# Patient Record
Sex: Female | Born: 1960
Health system: Southern US, Community
[De-identification: ages and names within clinical notes are randomized; demographics above are authoritative.]

## PROBLEM LIST (undated history)

## (undated) DIAGNOSIS — T7840XA Allergy, unspecified, initial encounter: Secondary | ICD-10-CM

## (undated) DIAGNOSIS — D126 Benign neoplasm of colon, unspecified: Secondary | ICD-10-CM

## (undated) DIAGNOSIS — N6099 Unspecified benign mammary dysplasia of unspecified breast: Secondary | ICD-10-CM

## (undated) DIAGNOSIS — D649 Anemia, unspecified: Secondary | ICD-10-CM

## (undated) HISTORY — DX: Anemia, unspecified: D64.9

## (undated) HISTORY — DX: Unspecified benign mammary dysplasia of unspecified breast: N60.99

## (undated) HISTORY — DX: Benign neoplasm of colon, unspecified: D12.6

## (undated) HISTORY — DX: Allergy, unspecified, initial encounter: T78.40XA

## (undated) HISTORY — PX: COLONOSCOPY: SHX174

---
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
## 2000-07-07 ENCOUNTER — Other Ambulatory Visit: Admission: RE | Admit: 2000-07-07 | Discharge: 2000-07-07 | Payer: Self-pay | Admitting: Obstetrics and Gynecology

## 2002-02-15 ENCOUNTER — Encounter: Admission: RE | Admit: 2002-02-15 | Discharge: 2002-02-15 | Payer: Self-pay | Admitting: Obstetrics and Gynecology

## 2002-02-15 ENCOUNTER — Encounter: Payer: Self-pay | Admitting: Obstetrics and Gynecology

## 2002-04-24 ENCOUNTER — Encounter: Payer: Self-pay | Admitting: Obstetrics and Gynecology

## 2002-04-24 ENCOUNTER — Encounter: Admission: RE | Admit: 2002-04-24 | Discharge: 2002-04-24 | Payer: Self-pay | Admitting: Unknown Physician Specialty

## 2002-10-07 ENCOUNTER — Other Ambulatory Visit: Admission: RE | Admit: 2002-10-07 | Discharge: 2002-10-07 | Payer: Self-pay | Admitting: Obstetrics and Gynecology

## 2003-10-13 ENCOUNTER — Other Ambulatory Visit: Admission: RE | Admit: 2003-10-13 | Discharge: 2003-10-13 | Payer: Self-pay | Admitting: Obstetrics and Gynecology

## 2004-11-01 ENCOUNTER — Other Ambulatory Visit: Admission: RE | Admit: 2004-11-01 | Discharge: 2004-11-01 | Payer: Self-pay | Admitting: Obstetrics and Gynecology

## 2006-02-21 ENCOUNTER — Other Ambulatory Visit: Admission: RE | Admit: 2006-02-21 | Discharge: 2006-02-21 | Payer: Self-pay | Admitting: Obstetrics and Gynecology

## 2006-05-30 ENCOUNTER — Emergency Department (HOSPITAL_COMMUNITY): Admission: EM | Admit: 2006-05-30 | Discharge: 2006-05-30 | Payer: Self-pay | Admitting: Family Medicine

## 2007-06-25 ENCOUNTER — Emergency Department (HOSPITAL_COMMUNITY): Admission: EM | Admit: 2007-06-25 | Discharge: 2007-06-25 | Payer: Self-pay | Admitting: Emergency Medicine

## 2007-07-02 ENCOUNTER — Emergency Department (HOSPITAL_COMMUNITY): Admission: EM | Admit: 2007-07-02 | Discharge: 2007-07-02 | Payer: Self-pay | Admitting: Family Medicine

## 2007-10-11 IMAGING — CT CT HEAD W/O CM
4 of 6 series · 16 of 47 positions shown, 17 images · non-contrast
Comparison: none

CLINICAL DATA: Syncope. Fall. Injury to the head.

HEAD CT WITHOUT CONTRAST
TECHNIQUE: 5mm collimated images were obtained from the skull base through the
vertex following the standard protocol without intravenous contrast.
TECHNIQUE: Multidetector CT imaging of the cervical spine was performed. 
Sagittal and coronal plane reformatted images were reconstructed from the axial
CT data, and were also reviewed.

[Series 3: head trauma 4.8 h47s · axial · 0.43mm/px · z∈[+937,+1023]mm · 4 of 30 slices shown, 5 images]
[im 6/30  brain]
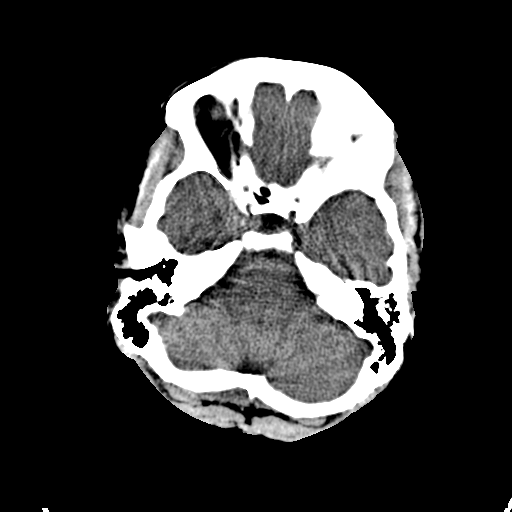
[im 6/30  bone]
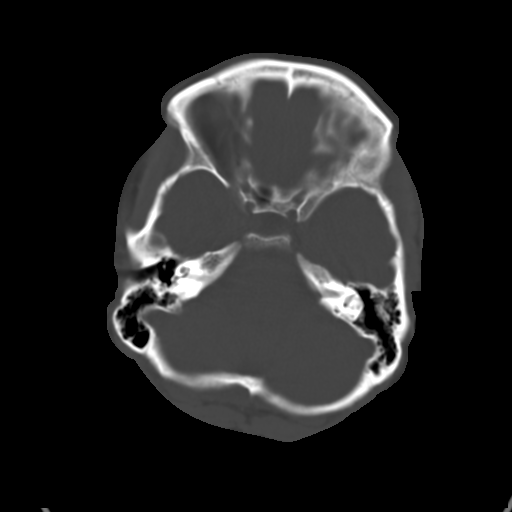
[im 12/30  brain]
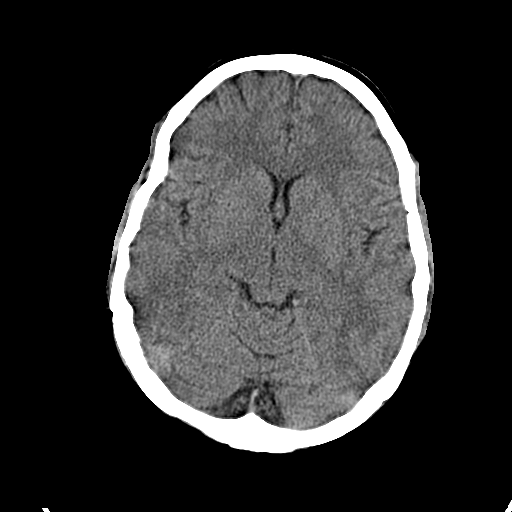
[im 18/30  brain]
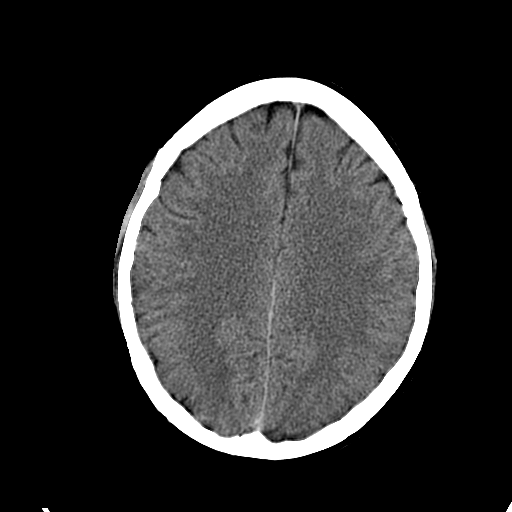
[im 24/30  brain]
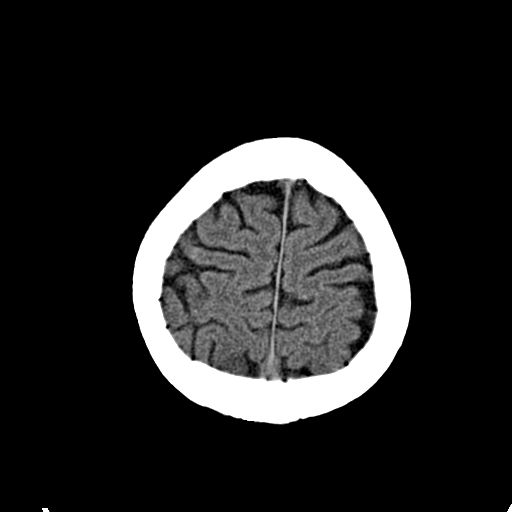

[Series 6: c_spine 2.0 b31s detail · axial · 0.29mm/px · z∈[+740,+922]mm · 8 of 111 slices shown]
[im 10/111  bone]
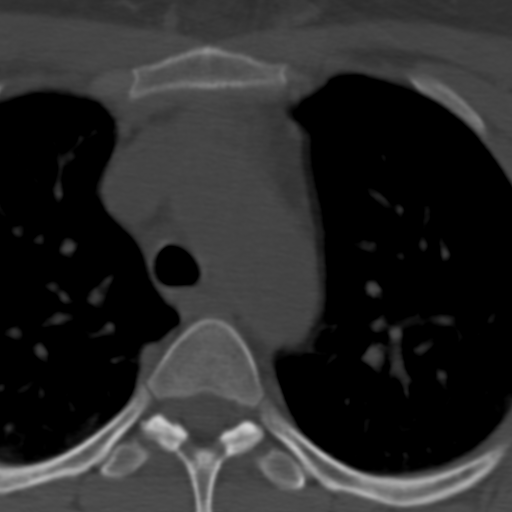
[im 24/111  bone]
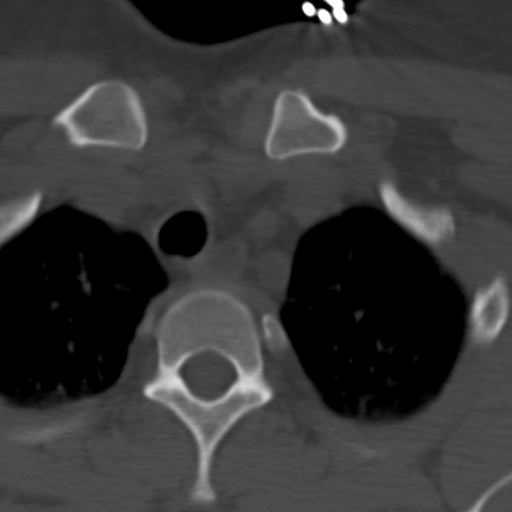
[im 34/111  bone]
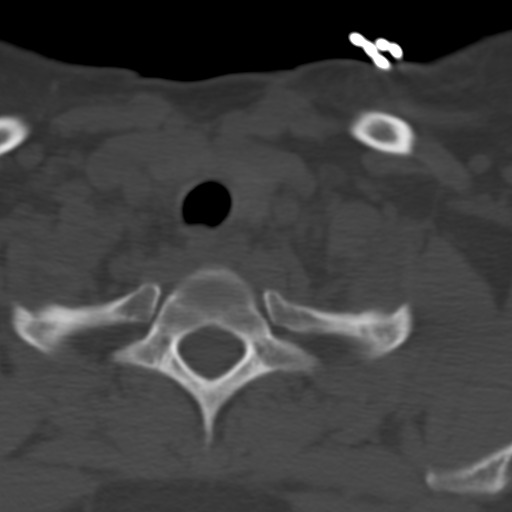
[im 48/111  bone]
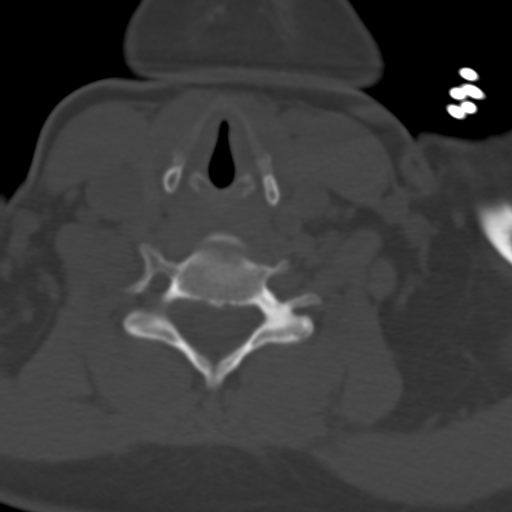
[im 63/111  bone]
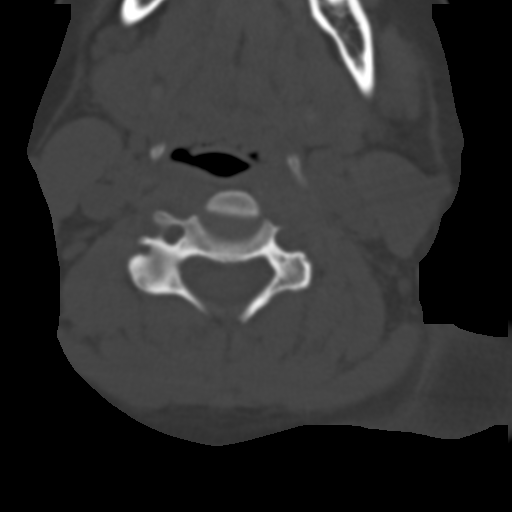
[im 77/111  bone]
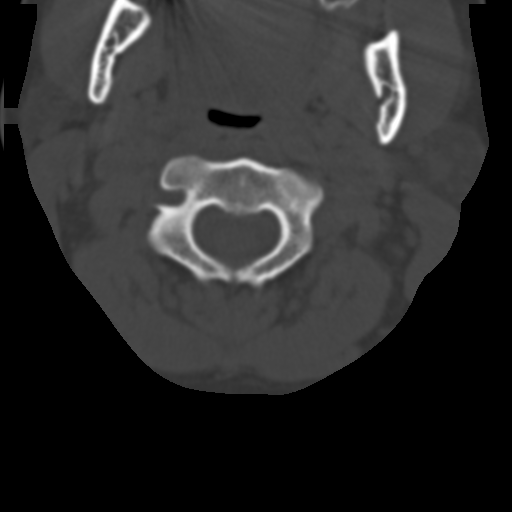
[im 87/111  bone]
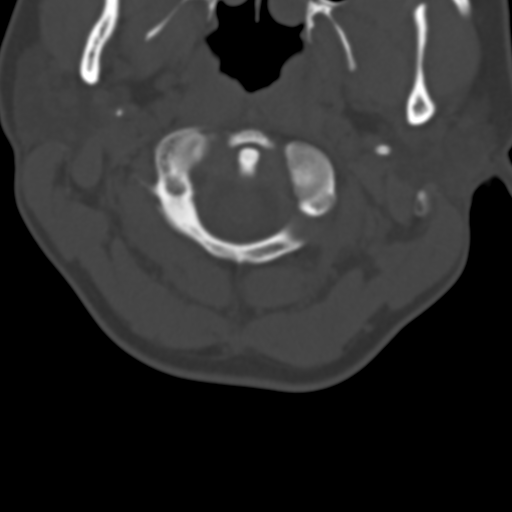
[im 101/111  bone]
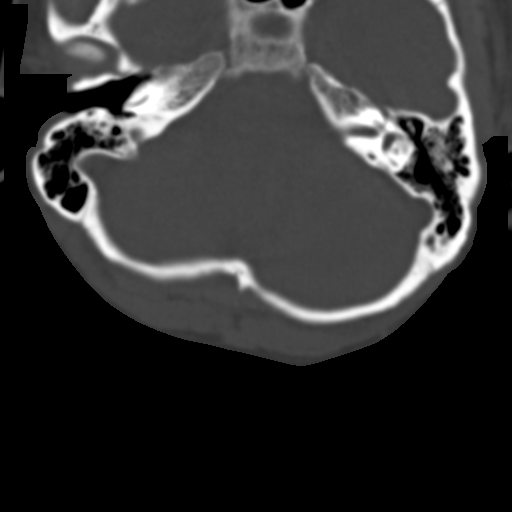

[Series 9: c_spine 2.0 spo thins · coronal · 0.38mm/px · 3 of 68 slices shown (1 of 2)]
[im 14/68  brain]
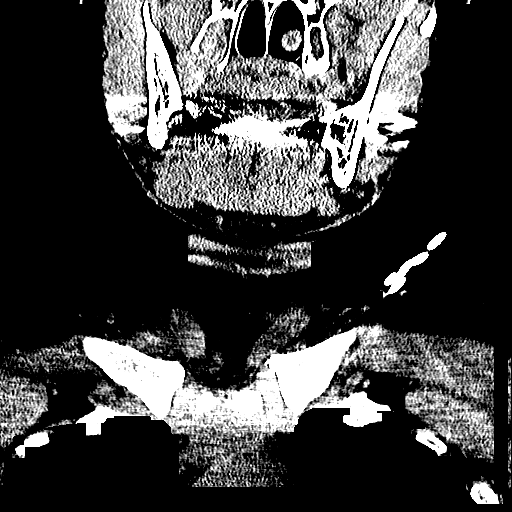
[im 27/68  brain]
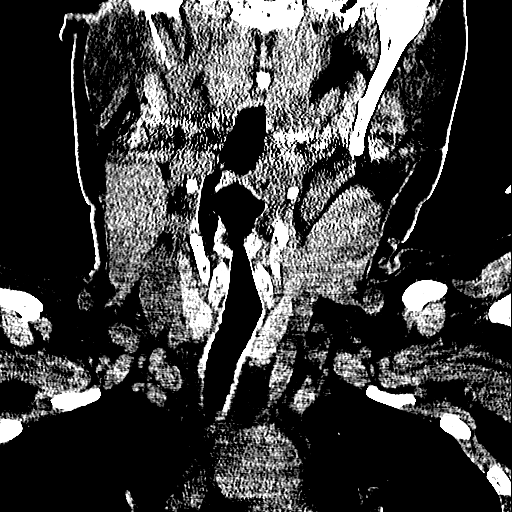
[im 41/68  brain]
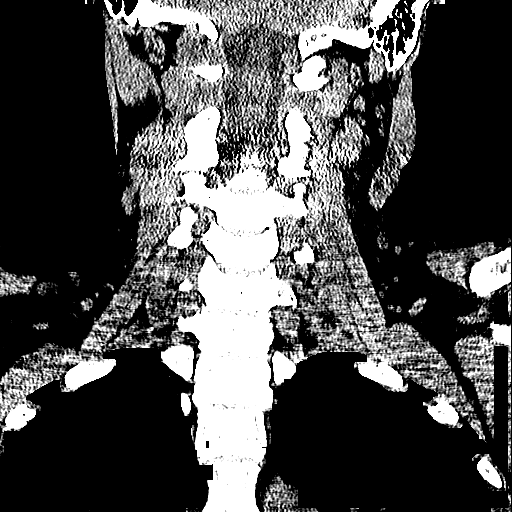

[Series 10: c_spine 2.0 spo thins · sagittal · 0.43mm/px · 1 of 57 slices shown (2 of 2)]
[im 29/57  brain]
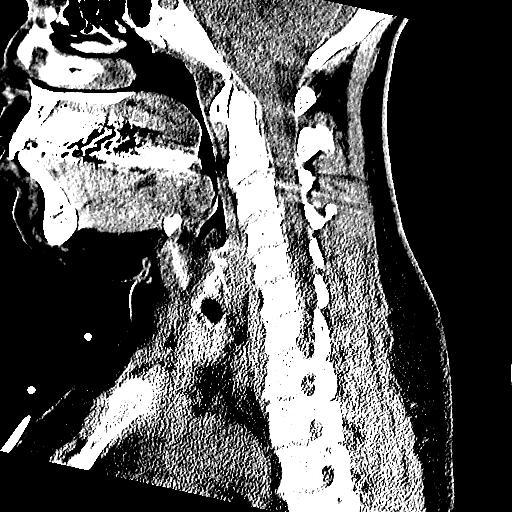

[16 of 47 positions shown; findings below may reference images not displayed]

FINDINGS: Posterior fossa structures appear unremarkable. The basilar cisterns
and ventricular system likewise appear unremarkable. No intracranial hemorrhage,
mass lesion, or acute CVA is noted.

Gas tracks along the right temporalis muscle along the superficial fashion. Did
not demonstrate a definite temporal bone fracture, but the appearance on image
12 of series 4 does raise suspicion for the possibility of an acute skull base
fracture extending through the roof of the sphenoid sinuses. We do not
demonstrate pneumocephalus or definite fluid in the sphenoid sinuses, so this
could be a volume averaging artifact. High-resolution maxillofacial CT may be
warranted.

IMPRESSION

1. Linear lucency extending along the roof of the sphenoid sinuses, raising
suspicion for the possibility of a skullbase fracture. However, there is no
pneumocephalus or fluid in the sphenoid sinuses, so this could be artifactual in
nature. Consider maxillofacial CT for further characterization.
2. Gas tracks along the superficial fascia of the right temporalis muscle, and
appears to connect to a laceration extending to the skin. No definite temporal
bone fracture is identified.

CERVICAL SPINE CT WITHOUT CONTRAST
FINDINGS: No cervical spine fracture or acute subluxation is identified. There
is some loss of the normal cervical lordosis. The prevertebral soft tissues
appear unremarkable.

IMPRESSION

1. No acute cervical spine fracture or subluxation is identified.

## 2007-10-11 IMAGING — CT CT MAXILLOFACIAL W/O CM
3 series · 16 of 47 positions shown, 19 images · non-contrast
Comparison: none

CLINICAL DATA: Fall with trauma to head.

MAXILLOFACIAL CT WITHOUT CONTRAST
TECHNIQUE: Axial and coronal plane CT imaging of the maxillofacial structures
was performed including the facial bones, paranasal sinuses, and orbits.  No
intravenous contrast was administered.

[Series 3: facial 2.0 h30s st · axial · 0.30mm/px · z∈[-202,-70]mm · 10 of 78 slices shown, 13 images]
[im 6/78  brain]
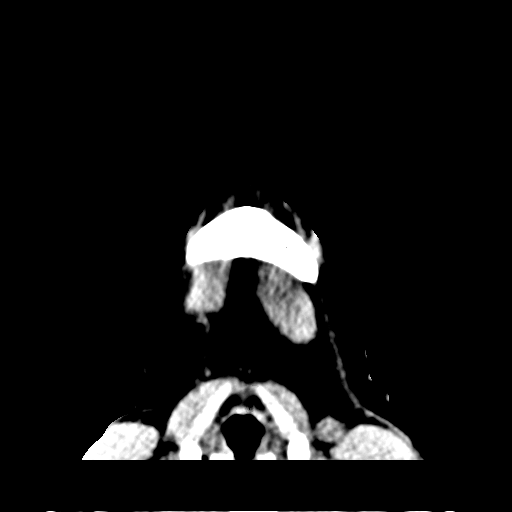
[im 6/78  bone]
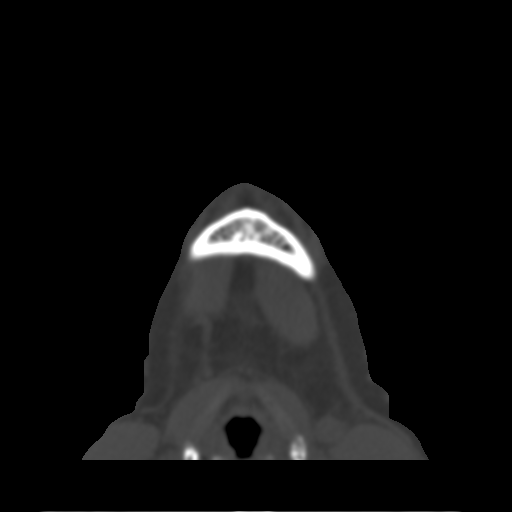
[im 14/78  bone]
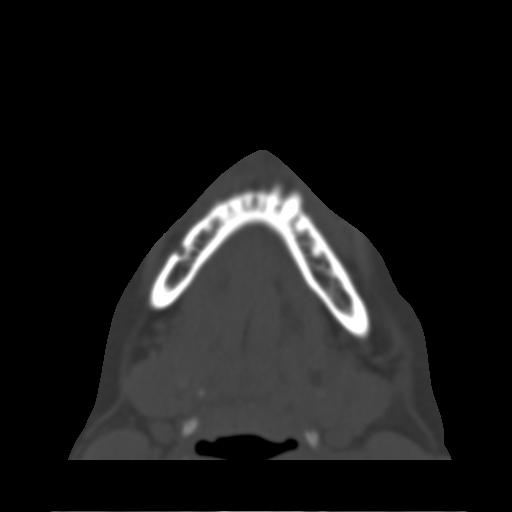
[im 22/78  bone]
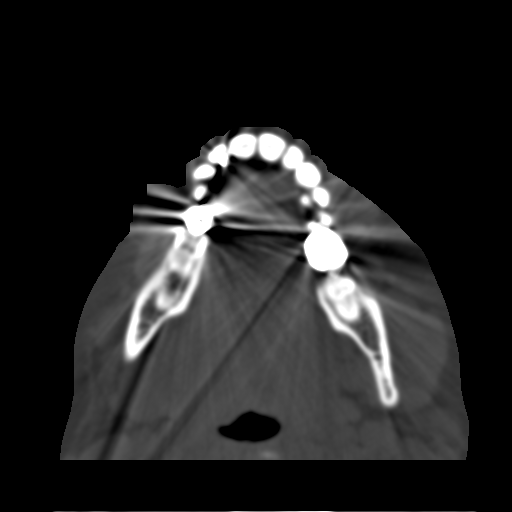
[im 27/78  bone]
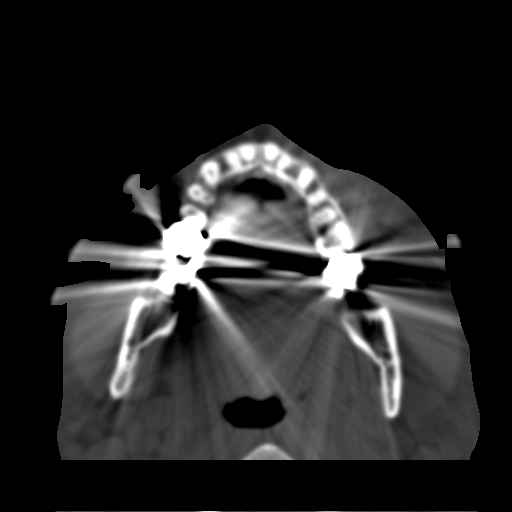
[im 35/78  brain]
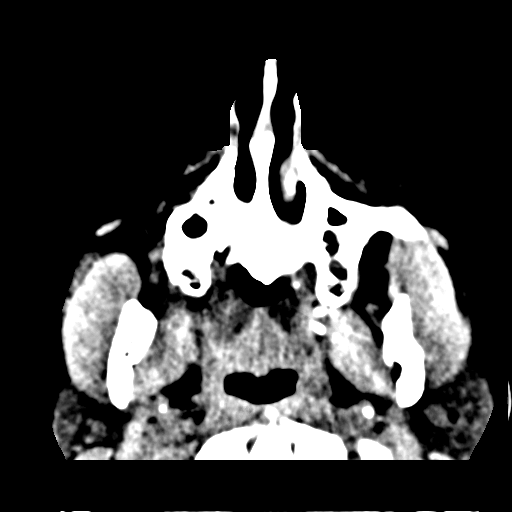
[im 35/78  bone]
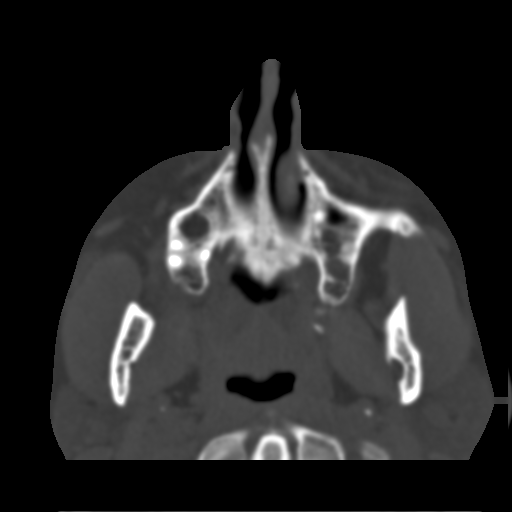
[im 43/78  bone]
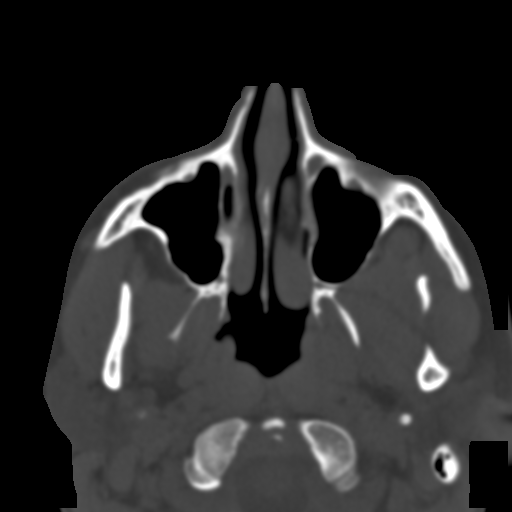
[im 51/78  bone]
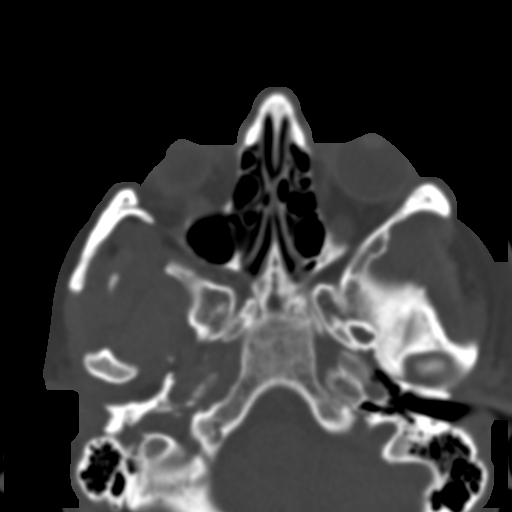
[im 59/78  bone]
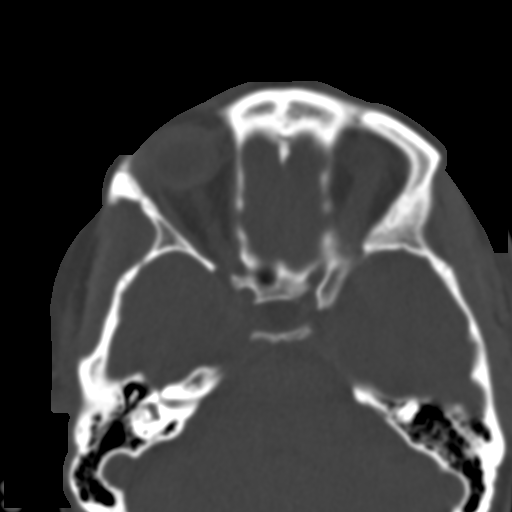
[im 64/78  brain]
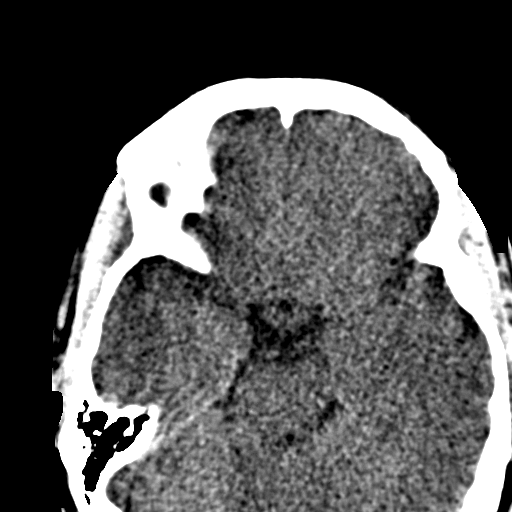
[im 64/78  bone]
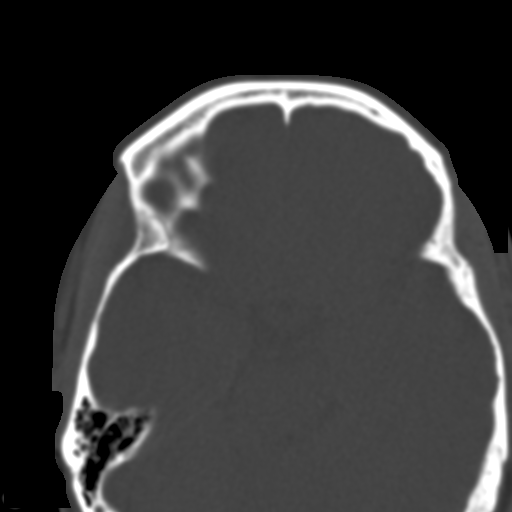
[im 72/78  bone]
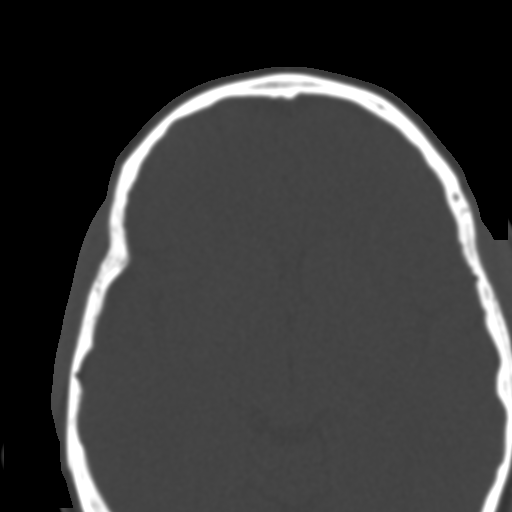

[Series 604: cor st · coronal · 0.30mm/px · 3 of 52 slices shown]
[im 18/52  bone]
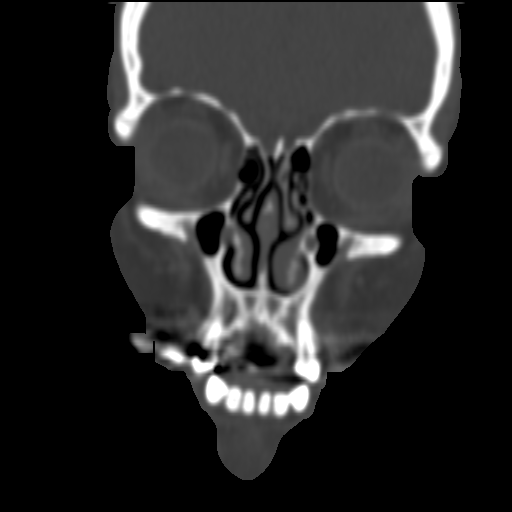
[im 23/52  bone]
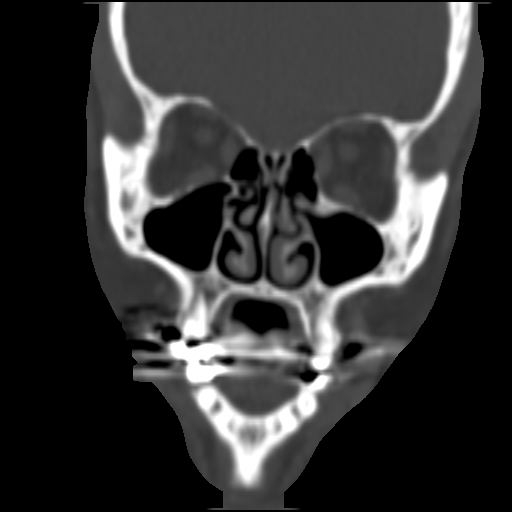
[im 29/52  bone]
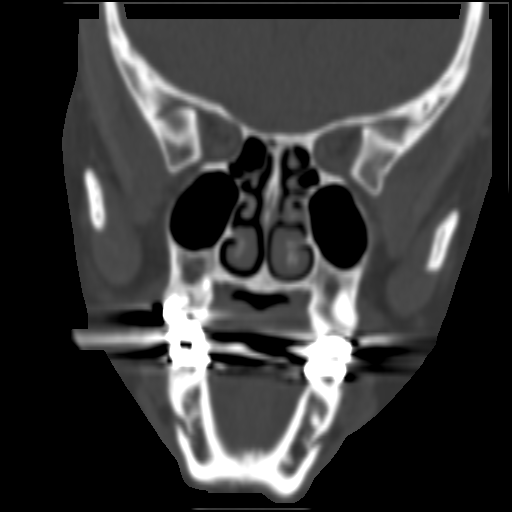

[Series 605: sag st · sagittal · 0.30mm/px · 3 of 67 slices shown]
[im 23/67  bone]
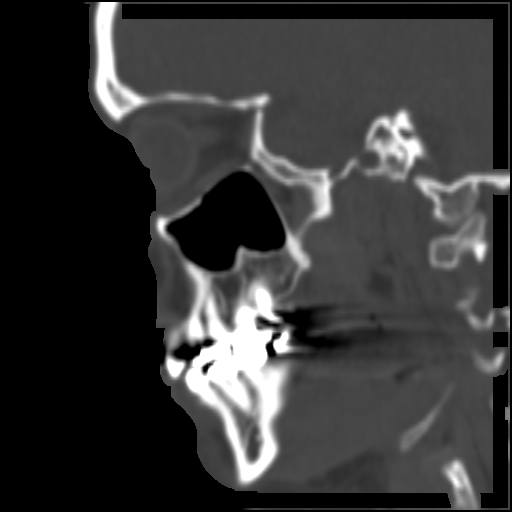
[im 34/67  bone]
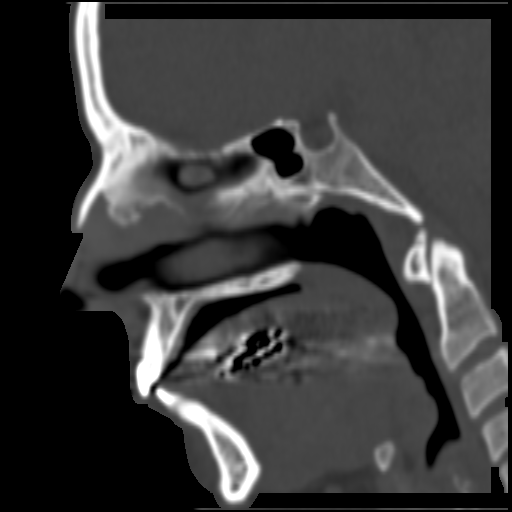
[im 45/67  bone]
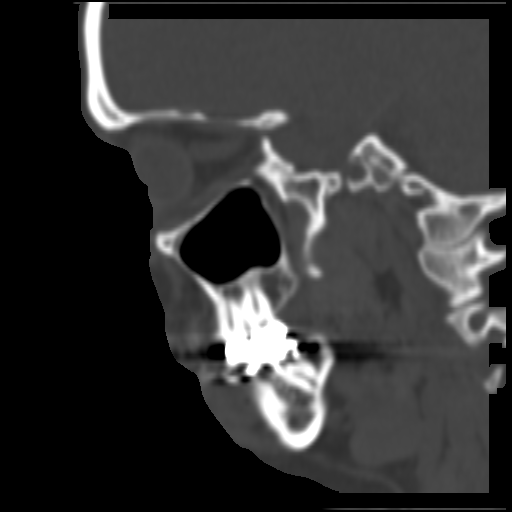

[16 of 47 positions shown; findings below may reference images not displayed]

FINDINGS: Once again we demonstrate an obliquely extending lucency along the
roof of the sphenoid sinuses, without pneumocephalus without fluid in the
sinuses. With specific windowing, there appears to the sclerosis along the
margin of this lucency, especially on the prior head CT. Given lack of findings
in the sinus and  the presence of the sclerosis along the margin, I suspect this
represents a prominent vascular groove rather than a fracture.

The mandible appears intact. The zygomatic arches and pterygoid plates appear
normal. The frontal sinuses are aplastic. No sinusitis is evident. The middle
ear structures appear grossly unremarkable. Again noted is gas tracking along
the posterior margin of the right temporalis muscle, related to a laceration.

IMPRESSION

1. Linear lucency along the roof of the sphenoid sinuses thought to represent a
vascular groove.
2. Gas tracks along the superficial fascia of the posterior portion of the right
temporalis muscle; this is related to a laceration.

## 2008-02-01 ENCOUNTER — Other Ambulatory Visit: Admission: RE | Admit: 2008-02-01 | Discharge: 2008-02-01 | Payer: Self-pay | Admitting: Obstetrics and Gynecology

## 2008-10-03 DIAGNOSIS — N6099 Unspecified benign mammary dysplasia of unspecified breast: Secondary | ICD-10-CM

## 2008-10-03 HISTORY — DX: Unspecified benign mammary dysplasia of unspecified breast: N60.99

## 2008-10-03 HISTORY — PX: BREAST LUMPECTOMY W/ NEEDLE LOCALIZATION: SHX1266

## 2009-02-04 ENCOUNTER — Other Ambulatory Visit: Admission: RE | Admit: 2009-02-04 | Discharge: 2009-02-04 | Payer: Self-pay | Admitting: Obstetrics and Gynecology

## 2009-06-04 ENCOUNTER — Encounter (INDEPENDENT_AMBULATORY_CARE_PROVIDER_SITE_OTHER): Payer: Self-pay | Admitting: General Surgery

## 2009-06-04 ENCOUNTER — Ambulatory Visit (HOSPITAL_BASED_OUTPATIENT_CLINIC_OR_DEPARTMENT_OTHER): Admission: RE | Admit: 2009-06-04 | Discharge: 2009-06-04 | Payer: Self-pay | Admitting: General Surgery

## 2009-07-29 ENCOUNTER — Ambulatory Visit: Payer: Self-pay | Admitting: Oncology

## 2009-08-19 LAB — CBC WITH DIFFERENTIAL (CANCER CENTER ONLY)
BASO%: 1 % (ref 0.0–2.0)
HCT: 40.8 % (ref 34.8–46.6)
LYMPH%: 35.7 % (ref 14.0–48.0)
MCH: 30.3 pg (ref 26.0–34.0)
MCV: 91 fL (ref 81–101)
MONO#: 0.4 10*3/uL (ref 0.1–0.9)
MONO%: 5.2 % (ref 0.0–13.0)
NEUT%: 56 % (ref 39.6–80.0)
Platelets: 532 10*3/uL — ABNORMAL HIGH (ref 145–400)
RDW: 12.8 % (ref 10.5–14.6)

## 2009-08-19 LAB — CMP (CANCER CENTER ONLY)
ALT(SGPT): 18 U/L (ref 10–47)
Albumin: 3.5 g/dL (ref 3.3–5.5)
CO2: 26 mEq/L (ref 18–33)
Calcium: 9 mg/dL (ref 8.0–10.3)
Chloride: 103 mEq/L (ref 98–108)
Glucose, Bld: 100 mg/dL (ref 73–118)
Potassium: 4 mEq/L (ref 3.3–4.7)
Sodium: 140 mEq/L (ref 128–145)
Total Bilirubin: 0.4 mg/dl (ref 0.20–1.60)
Total Protein: 6.6 g/dL (ref 6.4–8.1)

## 2009-08-19 LAB — FOLLICLE STIMULATING HORMONE: FSH: 22.9 m[IU]/mL

## 2009-08-19 LAB — LUTEINIZING HORMONE: LH: 21.5 m[IU]/mL

## 2009-10-23 ENCOUNTER — Ambulatory Visit: Payer: Self-pay | Admitting: Oncology

## 2009-12-15 ENCOUNTER — Ambulatory Visit: Payer: Self-pay | Admitting: Oncology

## 2009-12-29 LAB — CBC WITH DIFFERENTIAL (CANCER CENTER ONLY)
BASO#: 0.1 10*3/uL (ref 0.0–0.2)
EOS%: 2 % (ref 0.0–7.0)
Eosinophils Absolute: 0.2 10*3/uL (ref 0.0–0.5)
HGB: 14.2 g/dL (ref 11.6–15.9)
LYMPH#: 2.5 10*3/uL (ref 0.9–3.3)
MCHC: 33.8 g/dL (ref 32.0–36.0)
NEUT#: 4.8 10*3/uL (ref 1.5–6.5)
RBC: 4.86 10*6/uL (ref 3.70–5.32)

## 2009-12-29 LAB — CMP (CANCER CENTER ONLY)
AST: 16 U/L (ref 11–38)
Albumin: 4.1 g/dL (ref 3.3–5.5)
BUN, Bld: 13 mg/dL (ref 7–22)
Calcium: 9.6 mg/dL (ref 8.0–10.3)
Chloride: 102 mEq/L (ref 98–108)
Potassium: 4 mEq/L (ref 3.3–4.7)

## 2010-04-13 ENCOUNTER — Other Ambulatory Visit: Admission: RE | Admit: 2010-04-13 | Discharge: 2010-04-13 | Payer: Self-pay | Admitting: Obstetrics and Gynecology

## 2011-01-03 ENCOUNTER — Other Ambulatory Visit: Payer: Self-pay | Admitting: Oncology

## 2011-01-03 ENCOUNTER — Encounter (HOSPITAL_BASED_OUTPATIENT_CLINIC_OR_DEPARTMENT_OTHER): Payer: Commercial Managed Care - PPO | Admitting: Oncology

## 2011-01-03 DIAGNOSIS — C50919 Malignant neoplasm of unspecified site of unspecified female breast: Secondary | ICD-10-CM

## 2011-01-03 DIAGNOSIS — N6089 Other benign mammary dysplasias of unspecified breast: Secondary | ICD-10-CM

## 2011-01-03 LAB — COMPREHENSIVE METABOLIC PANEL
AST: 16 U/L (ref 0–37)
Albumin: 4.5 g/dL (ref 3.5–5.2)
Alkaline Phosphatase: 77 U/L (ref 39–117)
Potassium: 4.2 mEq/L (ref 3.5–5.3)
Sodium: 139 mEq/L (ref 135–145)
Total Protein: 6.7 g/dL (ref 6.0–8.3)

## 2011-01-03 LAB — CBC WITH DIFFERENTIAL/PLATELET
BASO%: 0.5 % (ref 0.0–2.0)
EOS%: 0.9 % (ref 0.0–7.0)
MCH: 28.8 pg (ref 25.1–34.0)
MCHC: 33.4 g/dL (ref 31.5–36.0)
MCV: 86.1 fL (ref 79.5–101.0)
MONO%: 5.2 % (ref 0.0–14.0)
NEUT#: 5.9 10*3/uL (ref 1.5–6.5)
RBC: 4.58 10*6/uL (ref 3.70–5.45)
RDW: 14.3 % (ref 11.2–14.5)

## 2011-01-07 LAB — BASIC METABOLIC PANEL
BUN: 14 mg/dL (ref 6–23)
CO2: 26 mEq/L (ref 19–32)
Calcium: 9.8 mg/dL (ref 8.4–10.5)
GFR calc non Af Amer: >60 mL/min (ref >60)
Glucose, Bld: 63 mg/dL — ABNORMAL LOW (ref 70–99)

## 2011-01-07 LAB — DIFFERENTIAL
Basophils Absolute: 0 10*3/uL (ref 0.0–0.1)
Eosinophils Relative: 2 % (ref 0–5)
Lymphocytes Relative: 34 % (ref 12–46)
Lymphs Abs: 2.3 10*3/uL (ref 0.7–4.0)
Neutro Abs: 3.9 10*3/uL (ref 1.7–7.7)

## 2011-01-07 LAB — CBC
HCT: 42.8 % (ref 36.0–46.0)
MCHC: 33.8 g/dL (ref 30.0–36.0)
Platelets: 559 10*3/uL — ABNORMAL HIGH (ref 150–400)
RDW: 15.5 % (ref 11.5–15.5)

## 2011-02-15 NOTE — Op Note (Signed)
Adriana Gonzalez, Adriana Gonzalez               ACCOUNT NO.:  [id number]   MEDICAL RECORD NO.:  [id number]          PATIENT TYPE:  AMB   LOCATION:  DSC                          FACILITY:  [facility]   PHYSICIAN:  Lennie Muckle, MD      DATE OF BIRTH:  15-Aug-1961   DATE OF PROCEDURE:  06/04/2009  DATE OF DISCHARGE:                               OPERATIVE REPORT   PREOPERATIVE DIAGNOSIS:  Right breast atypical ductal hyperplasia.   POSTOPERATIVE DIAGNOSIS:  Right breast atypical ductal hyperplasia.   PROCEDURE:  Right needle-localized breast biopsy.   SURGEON:  Amber L. Freida Busman, MD   ASSISTANT:  OR staff.   ANESTHESIA:  General endotracheal anesthesia.   FINDINGS:  Clip in the wire in the middle of the specimen.   COMPLICATIONS:  No immediate complications.   DRAINS:  No drains were placed.   ESTIMATED BLOOD LOSS:  No amount of blood loss.   INDICATIONS FOR PROCEDURE:  [name] is a 50 year old female who was  found to have calcifications on screening mammogram.  Biopsy revealed  atypical ductal hyperplasia.  Due to the approximately 8 times risks of  developing a breast cancer, she elected to have excision of the area.  I  talked to her about doing a needle localization since this was  nonpalpable and informed consent was obtained.   PROCEDURE IN DETAILS:  [name] had her wire placed at [facility].  [facility].  Her right  side was marked by me.  She was then taken to the operating room.  Once  in supine position placed under general endotracheal anesthesia, right  breast was prepped and draped in usual sterile fashion.  A time-out  proceeded indicating the patient and procedure were performed.  Using  the imaging studies with the wire, I placed an elliptical incision on  the lower outer quadrant of the breast.  I created a flap inferiorly to  bring the wire into the operative field.  Using blunt dissection, I  dissected out some distance on  the wire.  Using Allis clamp, I grasped  the tissue and using electrocautery performed the biopsy.  The specimen  was marked as short superior, long lateral, and the wire was anterior.  The specimen was removed intact.  Imaged it in the operating room with  the clip and the wire in place.  I inspected the wound bed and found no  evidence of bleeding.  No palpable areas of concern.  I then irrigated the tissues, closed with 3-0 Vicryl and a 4-0 Monocryl  for the skin.  Dermabond was placed for final dressing.  The patient was  extubated and transferred to the Postanesthesia Care Unit in stable  condition.  I will call with her final pathology.      Lennie Muckle, MD  Electronically Signed     ALA/MEDQ  D:  06/04/2009  T:  06/05/2009  Job:  [id number]   cc:   Artist Pais, M.D.  Dr. Samuella Cota

## 2011-12-05 ENCOUNTER — Other Ambulatory Visit: Payer: Self-pay | Admitting: Oncology

## 2011-12-05 DIAGNOSIS — C50919 Malignant neoplasm of unspecified site of unspecified female breast: Secondary | ICD-10-CM

## 2012-01-23 ENCOUNTER — Ambulatory Visit: Payer: 59 | Admitting: Oncology

## 2012-01-27 ENCOUNTER — Ambulatory Visit (HOSPITAL_BASED_OUTPATIENT_CLINIC_OR_DEPARTMENT_OTHER): Payer: 59 | Admitting: Oncology

## 2012-01-27 ENCOUNTER — Encounter: Payer: Self-pay | Admitting: Oncology

## 2012-01-27 ENCOUNTER — Other Ambulatory Visit (HOSPITAL_BASED_OUTPATIENT_CLINIC_OR_DEPARTMENT_OTHER): Payer: 59 | Admitting: Lab

## 2012-01-27 ENCOUNTER — Telehealth: Payer: Self-pay | Admitting: Oncology

## 2012-01-27 VITALS — BP 126/79 | HR 68 | Temp 98.5°F | Ht 61.5 in | Wt 189.2 lb

## 2012-01-27 DIAGNOSIS — N6089 Other benign mammary dysplasias of unspecified breast: Secondary | ICD-10-CM

## 2012-01-27 DIAGNOSIS — N6099 Unspecified benign mammary dysplasia of unspecified breast: Secondary | ICD-10-CM

## 2012-01-27 LAB — COMPREHENSIVE METABOLIC PANEL
ALT: 17 U/L (ref 0–35)
AST: 16 U/L (ref 0–37)
Creatinine, Ser: 0.97 mg/dL (ref 0.50–1.10)
Sodium: 136 mEq/L (ref 135–145)
Total Bilirubin: 0.3 mg/dL (ref 0.3–1.2)
Total Protein: 6.6 g/dL (ref 6.0–8.3)

## 2012-01-27 LAB — CBC WITH DIFFERENTIAL/PLATELET
BASO%: 0.6 % (ref 0.0–2.0)
EOS%: 1.9 % (ref 0.0–7.0)
HCT: 40.2 % (ref 34.8–46.6)
LYMPH%: 37.8 % (ref 14.0–49.7)
MCH: 28.2 pg (ref 25.1–34.0)
MCHC: 32.6 g/dL (ref 31.5–36.0)
MONO#: 0.5 10*3/uL (ref 0.1–0.9)
NEUT%: 52.9 % (ref 38.4–76.8)
Platelets: 485 10*3/uL — ABNORMAL HIGH (ref 145–400)
RBC: 4.65 10*6/uL (ref 3.70–5.45)
WBC: 7.8 10*3/uL (ref 3.9–10.3)
lymph#: 3 10*3/uL (ref 0.9–3.3)

## 2012-01-27 MED ORDER — RALOXIFENE HCL 60 MG PO TABS: 60.0000 mg | ORAL_TABLET | Freq: Every day | ORAL | Status: AC

## 2012-01-27 NOTE — Patient Instructions (Signed)
1. Continue evista as you are  2. I will see you back in 1 year

## 2012-01-27 NOTE — Progress Notes (Signed)
OFFICE PROGRESS NOTE  CC  No primary provider on file. No primary provider on file.  DIAGNOSIS: 51 year old female with atypical lobular hyperplasia the left breast status post lumpectomy  PRIOR THERAPY:  #1 patient was diagnosed with atypical lobular hyperplasia of left breast. She subsequently underwent a lumpectomy.  #2 she received chemoprevention with Evista 60 mg daily beginning genera 25 2011.  CURRENT THERAPY: Evista 60 mg daily for as a chemoprevention for breast cancer.  INTERVAL HISTORY: Adriana Gonzalez 51 y.o. female returns for Followup visit. Overall she is doing well she is without any significant complaints. She denies any fevers chills night sweats headaches shortness of breath chest pains palpitations she has no myalgias or arthralgias. Patient of states that she is not exercising as much as she should. Her weight has slightly risen but otherwise everything else remained stable and remainder of the 10 point review of systems is negative.  MEDICAL HISTORY: Past Medical History  Diagnosis Date  . Atypical lobular hyperplasia of breast   . Atypical lobular hyperplasia of breast 01/27/2012    ALLERGIES:  is allergic to penicillins; demerol; and sulfa antibiotics.  MEDICATIONS:  Current Outpatient Prescriptions  Medication Sig Dispense Refill  . EVISTA 60 MG tablet TAKE 1 TABLET BY MOUTH ONCE DAILY  30 tablet  2    SURGICAL HISTORY:  Past Surgical History  Procedure Date  . Breast lumpectomy w/ needle localization     REVIEW OF SYSTEMS:  Pertinent items are noted in HPI.   PHYSICAL EXAMINATION:  Patient is alert oriented x3 appears well in no acute distress. HEENT exam EOMI PERRLA sclerae anicteric no conjunctival pallor oral because is moist neck is supple lungs: Clear bilaterally to auscultation and percussion cardiovascular: Regular rate rhythm no murmurs gallops or rubs abdomen: Soft nontender nondistended bowel sounds are present no hepatosplenomegaly  extremities: No clubbing cyanosis or edema. Back: No CVA tenderness or spinal tenderness. Neuro: Patient's alert oriented x3 otherwise nonfocal. Breast examination: Left breast no masses nipple discharge no skin changes. Right breast reveals a well-healed surgical scar in the lower outer quadrant no skin changes no masses no nipple inversion or retraction.  ECOG PERFORMANCE STATUS: 0 - Asymptomatic  Blood pressure 126/79, pulse 68, temperature 98.5 F (36.9 C), temperature source Oral, height 5' 1.5" (1.562 m), weight 189 lb 3.2 oz (85.821 kg).  LABORATORY DATA: Lab Results  Component Value Date   WBC 7.8 01/27/2012   HGB 13.1 01/27/2012   HCT 40.2 01/27/2012   MCV 86.5 01/27/2012   PLT 485* 01/27/2012      Chemistry      Component Value Date/Time   NA 139 01/03/2011 1336   NA 139 01/03/2011 1336   NA 138 12/29/2009 1514   K 4.2 01/03/2011 1336   K 4.2 01/03/2011 1336   K 4.0 12/29/2009 1514   CL 106 01/03/2011 1336   CL 106 01/03/2011 1336   CL 102 12/29/2009 1514   CO2 23 01/03/2011 1336   CO2 23 01/03/2011 1336   CO2 30 12/29/2009 1514   BUN 14 01/03/2011 1336   BUN 14 01/03/2011 1336   BUN 13 12/29/2009 1514   CREATININE 1.01 01/03/2011 1336   CREATININE 1.01 01/03/2011 1336   CREATININE 0.8 12/29/2009 1514      Component Value Date/Time   CALCIUM 9.2 01/03/2011 1336   CALCIUM 9.2 01/03/2011 1336   CALCIUM 9.6 12/29/2009 1514   ALKPHOS 77 01/03/2011 1336   ALKPHOS 77 01/03/2011 1336   ALKPHOS 73  12/29/2009 1514   AST 16 01/03/2011 1336   AST 16 01/03/2011 1336   AST 16 12/29/2009 1514   ALT 21 01/03/2011 1336   ALT 21 01/03/2011 1336   BILITOT 0.3 01/03/2011 1336   BILITOT 0.3 01/03/2011 1336   BILITOT 0.60 12/29/2009 1514       RADIOGRAPHIC STUDIES:  No results found.  ASSESSMENT: 51 year old female with atypical lobular hyperplasia diagnosed in 2010. Patient underwent a lumpectomy in December 2010 and then was started on Evista in 10/27/2009. Overall she's tolerating it well. She has no evidence of new  disease. She is up-to-date on her mammograms.   PLAN: Continue Evista 60 mg daily as a chemopreventive agent. She will continue getting mammograms on a yearly basis. And I will plan on continuing to see her on a yearly basis. A total of 5 years of treatment is planned. I have emphasized to patient weight control exercise and healthy-eating habits.   All questions were answered. The patient knows to call the clinic with any problems, questions or concerns. We can certainly see the patient much sooner if necessary.  I spent 25 minutes counseling the patient face to face. The total time spent in the appointment was 30 minutes.    Drue Second, MD Medical/Oncology [facility] [phone] (beeper) [phone] (Office)  01/27/2012, 11:48 AM

## 2012-01-27 NOTE — Telephone Encounter (Signed)
gv pt appt schedule for April 2014.  °

## 2012-04-25 ENCOUNTER — Other Ambulatory Visit (HOSPITAL_COMMUNITY)
Admission: RE | Admit: 2012-04-25 | Discharge: 2012-04-25 | Disposition: A | Discharge status: Home / Self Care | Payer: 59 | Source: Ambulatory Visit | Attending: Obstetrics and Gynecology | Admitting: Obstetrics and Gynecology

## 2012-04-25 ENCOUNTER — Other Ambulatory Visit: Payer: Self-pay | Admitting: Obstetrics and Gynecology

## 2012-04-25 DIAGNOSIS — Z01419 Encounter for gynecological examination (general) (routine) without abnormal findings: Secondary | ICD-10-CM | POA: Insufficient documentation

## 2012-04-25 DIAGNOSIS — Z1151 Encounter for screening for human papillomavirus (HPV): Secondary | ICD-10-CM | POA: Insufficient documentation

## 2012-05-25 ENCOUNTER — Ambulatory Visit: Payer: 59 | Admitting: *Deleted

## 2012-07-13 ENCOUNTER — Encounter: Payer: Self-pay | Admitting: Gastroenterology

## 2012-09-17 ENCOUNTER — Ambulatory Visit (AMBULATORY_SURGERY_CENTER): Payer: 59 | Admitting: *Deleted

## 2012-09-17 VITALS — Ht 62.0 in | Wt 200.4 lb

## 2012-09-17 DIAGNOSIS — Z1211 Encounter for screening for malignant neoplasm of colon: Secondary | ICD-10-CM

## 2012-09-17 MED ORDER — NA SULFATE-K SULFATE-MG SULF 17.5-3.13-1.6 GM/177ML PO SOLN: ORAL | Status: DC

## 2012-09-28 ENCOUNTER — Telehealth: Payer: Self-pay

## 2012-09-28 NOTE — Telephone Encounter (Signed)
Pt has allergy flare-up without fever but some congestion.  No antibiotics or inhalers ordered at this time.  Wants to know if can have procedure on the 31st.  RN advised yes but call again if symptoms worsen or change.

## 2012-10-02 ENCOUNTER — Encounter: Payer: Self-pay | Admitting: Internal Medicine

## 2012-10-02 ENCOUNTER — Ambulatory Visit (AMBULATORY_SURGERY_CENTER): Payer: 59 | Admitting: Internal Medicine

## 2012-10-02 VITALS — BP 117/55 | HR 68 | Temp 97.8°F | Resp 16 | Ht 62.0 in | Wt 200.0 lb

## 2012-10-02 DIAGNOSIS — D126 Benign neoplasm of colon, unspecified: Secondary | ICD-10-CM

## 2012-10-02 DIAGNOSIS — K648 Other hemorrhoids: Secondary | ICD-10-CM

## 2012-10-02 DIAGNOSIS — Z1211 Encounter for screening for malignant neoplasm of colon: Secondary | ICD-10-CM

## 2012-10-02 MED ORDER — SODIUM CHLORIDE 0.9 % IV SOLN: 500.0000 mL | INTRAVENOUS | Status: DC

## 2012-10-02 NOTE — Op Note (Signed)
[facility] [address] [place], [address]   COLONOSCOPY PROCEDURE REPORT  PATIENT: Searra, Carnathan  MR#: [id number] BIRTHDATE: 1960/12/18 , 51  yrs. old GENDER: Female ENDOSCOPIST: Iva Boop, MD, [facility] PROCEDURE DATE:  10/02/2012 PROCEDURE:   Colonoscopy with snare polypectomy ASA CLASS:   Class I INDICATIONS:average risk screening. MEDICATIONS: propofol (Diprivan) 250mg  IV, MAC sedation, administered by CRNA, and These medications were titrated to patient response per physician's verbal order  DESCRIPTION OF PROCEDURE:   After the risks benefits and alternatives of the procedure were thoroughly explained, informed consent was obtained.  A digital rectal exam revealed no abnormalities of the rectum.   The LB [id number]  endoscope was introduced through the anus and advanced to the cecum, which was identified by both the appendix and ileocecal valve. No adverse events experienced.   The quality of the prep was Suprep excellent The instrument was then slowly withdrawn as the colon was fully examined.      COLON FINDINGS: A polypoid shaped sessile polyp measuring 4 mm in size was found in the ascending colon.  A polypectomy was performed with a cold snare.  The resection was complete and the polyp tissue was completely retrieved.   Small internal hemorrhoids were found. The colon mucosa was otherwise normal.   A right colon retroflexion was performed.  Retroflexed views revealed internal hemorrhoids. The time to cecum=3 minutes 07 seconds.  Withdrawal time=12 minutes 45 seconds.  The scope was withdrawn and the procedure completed. COMPLICATIONS: There were no complications.  ENDOSCOPIC IMPRESSION: 1.   Sessile polyp measuring 4 mm in size was found in the ascending colon; polypectomy was performed with a cold snare 2.   Small internal hemorrhoids 3.   The colon mucosa was otherwise normal - excellent prep  RECOMMENDATIONS: Timing of  repeat colonoscopy will be determined by pathology findings.   eSigned:  Iva Boop, MD, [facility] 10/02/2012 9:57 AM   cc: Artist Pais MD and The Patient

## 2012-10-02 NOTE — Progress Notes (Signed)
Patient did not experience any of the following events: a burn prior to discharge; a fall within the facility; wrong site/side/patient/procedure/implant event; or a hospital transfer or hospital admission upon discharge from the facility. (G8907) Patient did not have preoperative order for IV antibiotic SSI prophylaxis. (G8918)  

## 2012-10-02 NOTE — Patient Instructions (Addendum)
One very small polyp was removed. I did not take a photo. I think it is benign. It may mean I recommend a repeat colonoscopy in 5 years - I will let you know by mail.  You also have small internal hemorrhoids.  Prep was great!  Thank you for choosing me and [facility].  Iva Boop, MD, FACG YOU HAD AN ENDOSCOPIC PROCEDURE TODAY AT THE [facility]: Refer to the procedure report that was given to you for any specific questions about what was found during the examination.  If the procedure report does not answer your questions, please call your gastroenterologist to clarify.  If you requested that your care partner not be given the details of your procedure findings, then the procedure report has been included in a sealed envelope for you to review at your convenience later.  YOU SHOULD EXPECT: Some feelings of bloating in the abdomen. Passage of more gas than usual.  Walking can help get rid of the air that was put into your GI tract during the procedure and reduce the bloating. If you had a lower endoscopy (such as a colonoscopy or flexible sigmoidoscopy) you may notice spotting of blood in your stool or on the toilet paper. If you underwent a bowel prep for your procedure, then you may not have a normal bowel movement for a few days.  DIET: Your first meal following the procedure should be a light meal and then it is ok to progress to your normal diet.  A half-sandwich or bowl of soup is an example of a good first meal.  Heavy or fried foods are harder to digest and may make you feel nauseous or bloated.  Likewise meals heavy in dairy and vegetables can cause extra gas to form and this can also increase the bloating.  Drink plenty of fluids but you should avoid alcoholic beverages for 24 hours.  ACTIVITY: Your care partner should take you home directly after the procedure.  You should plan to take it easy, moving slowly for the rest of the day.  You can resume normal  activity the day after the procedure however you should NOT DRIVE or use heavy machinery for 24 hours (because of the sedation medicines used during the test).    SYMPTOMS TO REPORT IMMEDIATELY: A gastroenterologist can be reached at any hour.  During normal business hours, 8:30 AM to 5:00 PM Monday through Friday, call [phone].  After hours and on weekends, please call the GI answering service at [phone] who will take a message and have the physician on call contact you.   Following lower endoscopy (colonoscopy or flexible sigmoidoscopy):  Excessive amounts of blood in the stool  Significant tenderness or worsening of abdominal pains  Swelling of the abdomen that is new, acute  Fever of 100F or higher  FOLLOW UP: If any biopsies were taken you will be contacted by phone or by letter within the next 1-3 weeks.  Call your gastroenterologist if you have not heard about the biopsies in 3 weeks.  Our staff will call the home number listed on your records the next business day following your procedure to check on you and address any questions or concerns that you may have at that time regarding the information given to you following your procedure. This is a courtesy call and so if there is no answer at the home number and we have not heard from you through the emergency physician on call, we will  assume that you have returned to your regular daily activities without incident.  SIGNATURES/CONFIDENTIALITY: You and/or your care partner have signed paperwork which will be entered into your electronic medical record.  These signatures attest to the fact that that the information above on your After Visit Summary has been reviewed and is understood.  Full responsibility of the confidentiality of this discharge information lies with you and/or your care-partner.

## 2012-10-03 DIAGNOSIS — D126 Benign neoplasm of colon, unspecified: Secondary | ICD-10-CM

## 2012-10-03 HISTORY — DX: Benign neoplasm of colon, unspecified: D12.6

## 2012-10-04 ENCOUNTER — Telehealth: Payer: Self-pay

## 2012-10-04 NOTE — Telephone Encounter (Signed)
Left a message on the pt's answering machine to call if any questions or concerns at [phone]. [place]

## 2012-10-10 ENCOUNTER — Encounter: Payer: Self-pay | Admitting: Internal Medicine

## 2012-10-10 DIAGNOSIS — Z860100 Personal history of colon polyps, unspecified: Secondary | ICD-10-CM | POA: Insufficient documentation

## 2012-10-10 NOTE — Progress Notes (Signed)
Quick Note:  Diminutive adenoma Repeat colon about 10/2017 ______

## 2013-01-14 ENCOUNTER — Telehealth: Payer: Self-pay | Admitting: Oncology

## 2013-01-14 NOTE — Telephone Encounter (Signed)
Pt called to r/s 4/25 appt. appt r/s to next avail on 6/27. Pt has new d/t. There was a cancellation for 4/30 but pt can not come 4/30.

## 2013-01-25 ENCOUNTER — Other Ambulatory Visit: Payer: 59 | Admitting: Lab

## 2013-01-25 ENCOUNTER — Ambulatory Visit: Payer: 59 | Admitting: Oncology

## 2013-03-29 ENCOUNTER — Other Ambulatory Visit (HOSPITAL_BASED_OUTPATIENT_CLINIC_OR_DEPARTMENT_OTHER): Payer: 59 | Admitting: Lab

## 2013-03-29 ENCOUNTER — Ambulatory Visit (HOSPITAL_BASED_OUTPATIENT_CLINIC_OR_DEPARTMENT_OTHER): Payer: 59 | Admitting: Oncology

## 2013-03-29 ENCOUNTER — Telehealth: Payer: Self-pay | Admitting: *Deleted

## 2013-03-29 ENCOUNTER — Encounter: Payer: Self-pay | Admitting: Oncology

## 2013-03-29 VITALS — BP 114/77 | HR 98 | Temp 98.4°F | Resp 20 | Ht 62.0 in | Wt 186.3 lb

## 2013-03-29 DIAGNOSIS — N62 Hypertrophy of breast: Secondary | ICD-10-CM

## 2013-03-29 DIAGNOSIS — N6099 Unspecified benign mammary dysplasia of unspecified breast: Secondary | ICD-10-CM

## 2013-03-29 LAB — CBC WITH DIFFERENTIAL/PLATELET
BASO%: 0.5 % (ref 0.0–2.0)
EOS%: 2.7 % (ref 0.0–7.0)
HCT: 42 % (ref 34.8–46.6)
LYMPH%: 37.9 % (ref 14.0–49.7)
MCH: 27.4 pg (ref 25.1–34.0)
MCHC: 32.4 g/dL (ref 31.5–36.0)
MCV: 84.5 fL (ref 79.5–101.0)
MONO%: 5 % (ref 0.0–14.0)
NEUT%: 53.9 % (ref 38.4–76.8)
Platelets: 497 10*3/uL — ABNORMAL HIGH (ref 145–400)
RBC: 4.97 10*6/uL (ref 3.70–5.45)

## 2013-03-29 LAB — COMPREHENSIVE METABOLIC PANEL (CC13)
ALT: 25 U/L (ref 0–55)
AST: 17 U/L (ref 5–34)
Alkaline Phosphatase: 69 U/L (ref 40–150)
CO2: 27 mEq/L (ref 22–29)
Creatinine: 0.8 mg/dL (ref 0.6–1.1)
Total Bilirubin: 0.34 mg/dL (ref 0.20–1.20)

## 2013-03-29 NOTE — Progress Notes (Signed)
OFFICE PROGRESS NOTE  CC  No primary provider on file. No primary provider on file.  DIAGNOSIS: 52 year old female with atypical lobular hyperplasia the left breast status post lumpectomy  PRIOR THERAPY:  #1 patient was diagnosed with atypical lobular hyperplasia of left breast. She subsequently underwent a lumpectomy.  #2 she received chemoprevention with Evista 60 mg daily beginning October 27 2009.  CURRENT THERAPY: Evista 60 mg daily for as a chemoprevention for breast cancer.  INTERVAL HISTORY: Adriana Gonzalez 52 y.o. female returns for Followup visit. Overall she is doing well she is without any significant complaints. She denies any fevers chills night sweats headaches shortness of breath chest pains palpitations she has no myalgias or arthralgias. Patient of states that she is not exercising as much as she should. Her weight has slightly risen but otherwise everything else remained stable and remainder of the 10 point review of systems is negative.  MEDICAL HISTORY: Past Medical History  Diagnosis Date  . Atypical lobular hyperplasia of breast 2010    ALLERGIES:  is allergic to penicillins; demerol; and sulfa antibiotics.  MEDICATIONS:  Current Outpatient Prescriptions  Medication Sig Dispense Refill  . EVISTA 60 MG tablet TAKE 1 TABLET BY MOUTH ONCE DAILY  30 tablet  2  . loratadine (CLARITIN) 10 MG tablet Take 10 mg by mouth daily.       No current facility-administered medications for this visit.    SURGICAL HISTORY:  Past Surgical History  Procedure Laterality Date  . Breast lumpectomy w/ needle localization  2010    right  . Cesarean section  1987, 1993, 1997    x3  . Tonsillectomy  1980    REVIEW OF SYSTEMS:  Pertinent items are noted in HPI.   PHYSICAL EXAMINATION:  Patient is alert oriented x3 appears well in no acute distress. HEENT exam EOMI PERRLA sclerae anicteric no conjunctival pallor oral because is moist neck is supple lungs: Clear  bilaterally to auscultation and percussion cardiovascular: Regular rate rhythm no murmurs gallops or rubs abdomen: Soft nontender nondistended bowel sounds are present no hepatosplenomegaly extremities: No clubbing cyanosis or edema. Back: No CVA tenderness or spinal tenderness. Neuro: Patient's alert oriented x3 otherwise nonfocal. Breast examination: Left breast no masses nipple discharge no skin changes. Right breast reveals a well-healed surgical scar in the lower outer quadrant no skin changes no masses no nipple inversion or retraction.  ECOG PERFORMANCE STATUS: 0 - Asymptomatic  Blood pressure 114/77, pulse 98, temperature 98.4 F (36.9 C), temperature source Oral, resp. rate 20, height 5\' 2"  (1.575 m), weight 186 lb 4.8 oz (84.505 kg).  LABORATORY DATA: Lab Results  Component Value Date   WBC 7.4 03/29/2013   HGB 13.6 03/29/2013   HCT 42.0 03/29/2013   MCV 84.5 03/29/2013   PLT 497* 03/29/2013      Chemistry      Component Value Date/Time   NA 136 01/27/2012 1113   NA 138 12/29/2009 1514   K 4.7 01/27/2012 1113   K 4.0 12/29/2009 1514   CL 103 01/27/2012 1113   CL 102 12/29/2009 1514   CO2 26 01/27/2012 1113   CO2 30 12/29/2009 1514   BUN 17 01/27/2012 1113   BUN 13 12/29/2009 1514   CREATININE 0.97 01/27/2012 1113   CREATININE 0.8 12/29/2009 1514      Component Value Date/Time   CALCIUM 9.8 01/27/2012 1113   CALCIUM 9.6 12/29/2009 1514   ALKPHOS 68 01/27/2012 1113   ALKPHOS 73 12/29/2009 1514  AST 16 01/27/2012 1113   AST 16 12/29/2009 1514   ALT 17 01/27/2012 1113   BILITOT 0.3 01/27/2012 1113   BILITOT 0.60 12/29/2009 1514       RADIOGRAPHIC STUDIES:  No results found.  ASSESSMENT: 52 year old female with atypical lobular hyperplasia diagnosed in 2010. Patient underwent a lumpectomy in December 2010 and then was started on Evista in 10/27/2009. Overall she's tolerating it well. She has no evidence of new disease. She is up-to-date on her mammograms.   PLAN: Continue Evista 60  mg daily as a chemopreventive agent. She will continue getting mammograms on a yearly basis. And I will plan on continuing to see her on a yearly basis. A total of 5 years of treatment is planned. I have emphasized to patient weight control exercise and healthy-eating habits.   All questions were answered. The patient knows to call the clinic with any problems, questions or concerns. We can certainly see the patient much sooner if necessary.  I spent 15 minutes counseling the patient face to face. The total time spent in the appointment was 30 minutes.    Drue Second, MD Medical/Oncology [facility] [phone] (beeper) [phone] (Office)  03/29/2013, 12:25 PM

## 2013-03-29 NOTE — Patient Instructions (Addendum)
Doing well   continue evista 60 mg daily  I will see you back in 1 year

## 2013-03-29 NOTE — Telephone Encounter (Signed)
appts made and printed...td 

## 2013-04-16 ENCOUNTER — Other Ambulatory Visit: Payer: Self-pay | Admitting: *Deleted

## 2013-04-16 DIAGNOSIS — C50919 Malignant neoplasm of unspecified site of unspecified female breast: Secondary | ICD-10-CM

## 2013-04-16 MED ORDER — RALOXIFENE HCL 60 MG PO TABS: ORAL_TABLET | ORAL | Status: DC

## 2014-03-21 ENCOUNTER — Telehealth: Payer: Self-pay | Admitting: Hematology and Oncology

## 2014-03-21 NOTE — Telephone Encounter (Signed)
, °

## 2014-03-31 ENCOUNTER — Other Ambulatory Visit: Payer: 59

## 2014-03-31 ENCOUNTER — Ambulatory Visit: Payer: 59 | Admitting: Oncology

## 2014-04-28 ENCOUNTER — Other Ambulatory Visit: Payer: Self-pay | Admitting: Oncology

## 2014-04-28 DIAGNOSIS — C50919 Malignant neoplasm of unspecified site of unspecified female breast: Secondary | ICD-10-CM

## 2014-04-28 DIAGNOSIS — N62 Hypertrophy of breast: Secondary | ICD-10-CM

## 2014-06-16 ENCOUNTER — Telehealth: Payer: Self-pay | Admitting: Hematology and Oncology

## 2014-06-16 NOTE — Telephone Encounter (Signed)
cld & spoke to pt spouse Rob and left appt time & date spouse stated will let pt know

## 2014-06-25 ENCOUNTER — Other Ambulatory Visit: Payer: 59

## 2014-06-25 ENCOUNTER — Ambulatory Visit: Payer: 59 | Admitting: Hematology and Oncology

## 2014-06-25 ENCOUNTER — Other Ambulatory Visit: Payer: Self-pay | Admitting: *Deleted

## 2014-06-25 DIAGNOSIS — N6099 Unspecified benign mammary dysplasia of unspecified breast: Secondary | ICD-10-CM

## 2014-06-26 ENCOUNTER — Other Ambulatory Visit (HOSPITAL_BASED_OUTPATIENT_CLINIC_OR_DEPARTMENT_OTHER): Payer: 59

## 2014-06-26 ENCOUNTER — Ambulatory Visit: Payer: 59 | Admitting: Internal Medicine

## 2014-06-26 ENCOUNTER — Ambulatory Visit (HOSPITAL_BASED_OUTPATIENT_CLINIC_OR_DEPARTMENT_OTHER): Payer: 59 | Admitting: Hematology and Oncology

## 2014-06-26 VITALS — BP 123/84 | HR 70 | Temp 98.2°F | Resp 18 | Ht 62.0 in | Wt 189.9 lb

## 2014-06-26 DIAGNOSIS — N62 Hypertrophy of breast: Secondary | ICD-10-CM

## 2014-06-26 DIAGNOSIS — N6099 Unspecified benign mammary dysplasia of unspecified breast: Secondary | ICD-10-CM

## 2014-06-26 LAB — CBC WITH DIFFERENTIAL/PLATELET
BASO%: 0.8 % (ref 0.0–2.0)
BASOS ABS: 0.1 10*3/uL (ref 0.0–0.1)
EOS%: 1.8 % (ref 0.0–7.0)
Eosinophils Absolute: 0.1 10*3/uL (ref 0.0–0.5)
HCT: 44.5 % (ref 34.8–46.6)
HEMOGLOBIN: 14.5 g/dL (ref 11.6–15.9)
LYMPH#: 2.8 10*3/uL (ref 0.9–3.3)
LYMPH%: 38.4 % (ref 14.0–49.7)
MCH: 28.7 pg (ref 25.1–34.0)
MCHC: 32.5 g/dL (ref 31.5–36.0)
MCV: 88.3 fL (ref 79.5–101.0)
MONO#: 0.5 10*3/uL (ref 0.1–0.9)
MONO%: 6.3 % (ref 0.0–14.0)
NEUT%: 52.7 % (ref 38.4–76.8)
NEUTROS ABS: 3.8 10*3/uL (ref 1.5–6.5)
Platelets: 476 10*3/uL — ABNORMAL HIGH (ref 145–400)
RBC: 5.04 10*6/uL (ref 3.70–5.45)
RDW: 15.6 % — AB (ref 11.2–14.5)
WBC: 7.3 10*3/uL (ref 3.9–10.3)

## 2014-06-26 LAB — COMPREHENSIVE METABOLIC PANEL (CC13)
ALBUMIN: 4 g/dL (ref 3.5–5.0)
ALK PHOS: 72 U/L (ref 40–150)
ALT: 27 U/L (ref 0–55)
AST: 18 U/L (ref 5–34)
Anion Gap: 10 mEq/L (ref 3–11)
BUN: 14.9 mg/dL (ref 7.0–26.0)
CALCIUM: 9.8 mg/dL (ref 8.4–10.4)
CHLORIDE: 108 meq/L (ref 98–109)
CO2: 24 mEq/L (ref 22–29)
Creatinine: 1 mg/dL (ref 0.6–1.1)
Glucose: 110 mg/dl (ref 70–140)
POTASSIUM: 4.5 meq/L (ref 3.5–5.1)
SODIUM: 142 meq/L (ref 136–145)
TOTAL PROTEIN: 7.1 g/dL (ref 6.4–8.3)
Total Bilirubin: 0.37 mg/dL (ref 0.20–1.20)

## 2014-06-26 NOTE — Assessment & Plan Note (Signed)
Atypical lobular hyperplasia diagnosed in 2000 and underwent lumpectomy in December 2010 followed by Evista with the plan to treat her for 5 years. She will complete 5 years by the end of this year. She wishes to followup with her primary care and her GYN doctors. I think his completely from.. She gets mammograms done that [place]. I do not have a copy of her previous mammogram. We will request for it. I recommended that she should get annual mammograms breast exams and followup with her GYN or primary care physicians.

## 2014-06-26 NOTE — Progress Notes (Signed)
Patient has no care team.  DIAGNOSIS: Atypical lobular hyperplasia diagnosed in 2010  CHIEF COMPLIANT: Annual followup of atypical lobular hyperplasia  INTERVAL HISTORY:  Adriana Gonzalez is a 53 year old Caucasian lady with above-mentioned history of atypical lobular hyperplasia who has been coming annually for followup. She has been on Evista. She has been tolerating it very well without any problems or concerns. She gets mammograms and followups every year. She hasn't received a mammogram this year. She goes to her GYN and primary care physician's. They have been following her very closely with breast exams and mammograms annually. She will complete 5 years of Evista therapy by the end of this year. She wishes to continue her annual followups with her OB GYN and primary care physicians.  REVIEW OF SYSTEMS:   Constitutional: Denies fevers, chills or abnormal weight loss Eyes: Denies blurriness of vision Ears, nose, mouth, throat, and face: Denies mucositis or sore throat Respiratory: Denies cough, dyspnea or wheezes Cardiovascular: Denies palpitation, chest discomfort or lower extremity swelling Gastrointestinal:  Denies nausea, heartburn or change in bowel habits Skin: Denies abnormal skin rashes Lymphatics: Denies new lymphadenopathy or easy bruising Neurological:Denies numbness, tingling or new weaknesses Behavioral/Psych: Mood is stable, no new changes  Breast:  denies any pain or lumps or nodules in either breasts All other systems were reviewed with the patient and are negative.  I have reviewed the past medical history, past surgical history, social history and family history with the patient and they are unchanged from previous note.  ALLERGIES:  is allergic to penicillins; demerol; and sulfa antibiotics.  MEDICATIONS:  Current Outpatient Prescriptions  Medication Sig Dispense Refill  . cholecalciferol (VITAMIN D) 1000 UNITS tablet Take 1,000 Units by mouth daily.      Marland Kitchen loratadine  (CLARITIN) 10 MG tablet Take 10 mg by mouth daily.      . Multiple Vitamin (MULTIVITAMIN) tablet Take 1 tablet by mouth. 3 times a week      . Omega-3 Fatty Acids (FISH OIL) 1000 MG CAPS Take by mouth. 4 times a week      . raloxifene (EVISTA) 60 MG tablet TAKE 1 TABLET BY MOUTH ONCE DAILY  90 tablet  1   No current facility-administered medications for this visit.    PHYSICAL EXAMINATION: ECOG PERFORMANCE STATUS: 0 - Asymptomatic  Filed Vitals:   06/26/14 0833  BP: 123/84  Pulse: 70  Temp: 98.2 F (36.8 C)  Resp: 18   Filed Weights   06/26/14 0833  Weight: 189 lb 14.4 oz (86.138 kg)    GENERAL:alert, no distress and comfortable SKIN: skin color, texture, turgor are normal, no rashes or significant lesions EYES: normal, Conjunctiva are pink and non-injected, sclera clear OROPHARYNX:no exudate, no erythema and lips, buccal mucosa, and tongue normal  NECK: supple, thyroid normal size, non-tender, without nodularity LYMPH:  no palpable lymphadenopathy in the cervical, axillary or inguinal LUNGS: clear to auscultation and percussion with normal breathing effort HEART: regular rate & rhythm and no murmurs and no lower extremity edema ABDOMEN:abdomen soft, non-tender and normal bowel sounds Musculoskeletal:no cyanosis of digits and no clubbing  NEURO: alert & oriented x 3 with fluent speech, no focal motor/sensory deficits  LABORATORY DATA:  I have reviewed the data as listed   Chemistry      Component Value Date/Time   NA 142 06/26/2014 0818   NA 136 01/27/2012 1113   NA 138 12/29/2009 1514   K 4.5 06/26/2014 0818   K 4.7 01/27/2012 1113   K 4.0  12/29/2009 1514   CL 103 01/27/2012 1113   CL 102 12/29/2009 1514   CO2 24 06/26/2014 0818   CO2 26 01/27/2012 1113   CO2 30 12/29/2009 1514   BUN 14.9 06/26/2014 0818   BUN 17 01/27/2012 1113   BUN 13 12/29/2009 1514   CREATININE 1.0 06/26/2014 0818   CREATININE 0.97 01/27/2012 1113   CREATININE 0.8 12/29/2009 1514      Component Value  Date/Time   CALCIUM 9.8 06/26/2014 0818   CALCIUM 9.8 01/27/2012 1113   CALCIUM 9.6 12/29/2009 1514   ALKPHOS 72 06/26/2014 0818   ALKPHOS 68 01/27/2012 1113   ALKPHOS 73 12/29/2009 1514   AST 18 06/26/2014 0818   AST 16 01/27/2012 1113   AST 16 12/29/2009 1514   ALT 27 06/26/2014 0818   ALT 17 01/27/2012 1113   ALT 24 12/29/2009 1514   BILITOT 0.37 06/26/2014 0818   BILITOT 0.3 01/27/2012 1113   BILITOT 0.60 12/29/2009 1514       Lab Results  Component Value Date   WBC 7.3 06/26/2014   HGB 14.5 06/26/2014   HCT 44.5 06/26/2014   MCV 88.3 06/26/2014   PLT 476* 06/26/2014   NEUTROABS 3.8 06/26/2014     RADIOGRAPHIC STUDIES: No results found.   ASSESSMENT & PLAN:  Atypical lobular hyperplasia of breast Atypical lobular hyperplasia diagnosed in 2000 and underwent lumpectomy in December 2010 followed by Evista with the plan to treat her for 5 years. She will complete 5 years by the end of this year. She wishes to followup with her primary care and her GYN doctors. I think his completely from.. She gets mammograms done that [place]. I do not have a copy of her previous mammogram. We will request for it. I recommended that she should get annual mammograms breast exams and followup with her GYN or primary care physicians.   No orders of the defined types were placed in this encounter.   The patient has a good understanding of the overall plan. she agrees with it. She will call with any problems that may develop before her next visit here.  I spent 15 minutes counseling the patient face to face. The total time spent in the appointment was 15 minutes and more than 50% was on counseling and review of test results    Rulon Eisenmenger, MD 06/26/2014 9:13 AM

## 2014-08-04 ENCOUNTER — Ambulatory Visit: Payer: 59 | Admitting: Internal Medicine

## 2014-09-09 ENCOUNTER — Encounter: Payer: Self-pay | Admitting: Internal Medicine

## 2014-09-09 ENCOUNTER — Other Ambulatory Visit (INDEPENDENT_AMBULATORY_CARE_PROVIDER_SITE_OTHER): Payer: 59

## 2014-09-09 ENCOUNTER — Ambulatory Visit (INDEPENDENT_AMBULATORY_CARE_PROVIDER_SITE_OTHER): Payer: 59 | Admitting: Internal Medicine

## 2014-09-09 VITALS — BP 114/72 | HR 71 | Temp 98.2°F | Resp 14 | Ht 62.0 in | Wt 194.4 lb

## 2014-09-09 DIAGNOSIS — Z0000 Encounter for general adult medical examination without abnormal findings: Secondary | ICD-10-CM

## 2014-09-09 DIAGNOSIS — H9192 Unspecified hearing loss, left ear: Secondary | ICD-10-CM

## 2014-09-09 DIAGNOSIS — E669 Obesity, unspecified: Secondary | ICD-10-CM

## 2014-09-09 LAB — LIPID PANEL
Cholesterol: 247 mg/dL — ABNORMAL HIGH (ref 0–200)
HDL: 71.6 mg/dL (ref >39.00)
LDL CALC: 156 mg/dL — AB (ref 0–99)
NonHDL: 175.4
Total CHOL/HDL Ratio: 3
Triglycerides: 97 mg/dL (ref 0.0–149.0)
VLDL: 19.4 mg/dL (ref 0.0–40.0)

## 2014-09-09 LAB — HEMOGLOBIN A1C: HEMOGLOBIN A1C: 5.7 % (ref 4.6–6.5)

## 2014-09-09 NOTE — Patient Instructions (Signed)
We will send you for your hearing test. We will check your blood work today and call you back with the results.  We will see back next year for your physical. If you have any new problems or questions before then please feel free to call our office.  It may be worthwhile to work on increasing the amount of exercise you do while taking time for yourself.  Exercise to Stay Healthy Exercise helps you become and stay healthy. EXERCISE IDEAS AND TIPS Choose exercises that:  You enjoy.  Fit into your day. You do not need to exercise really hard to be healthy. You can do exercises at a slow or medium level and stay healthy. You can:  Stretch before and after working out.  Try yoga, Pilates, or tai chi.  Lift weights.  Walk fast, swim, jog, run, climb stairs, bicycle, dance, or rollerskate.  Take aerobic classes. Exercises that burn about 150 calories:  Running 1  miles in 15 minutes.  Playing volleyball for 45 to 60 minutes.  Washing and waxing a car for 45 to 60 minutes.  Playing touch football for 45 minutes.  Walking 1  miles in 35 minutes.  Pushing a stroller 1  miles in 30 minutes.  Playing basketball for 30 minutes.  Raking leaves for 30 minutes.  Bicycling 5 miles in 30 minutes.  Walking 2 miles in 30 minutes.  Dancing for 30 minutes.  Shoveling snow for 15 minutes.  Swimming laps for 20 minutes.  Walking up stairs for 15 minutes.  Bicycling 4 miles in 15 minutes.  Gardening for 30 to 45 minutes.  Jumping rope for 15 minutes.  Washing windows or floors for 45 to 60 minutes. Document Released: 10/22/2010 Document Revised: 12/12/2011 Document Reviewed: 10/22/2010 [facility] Patient Information 2015 [place], [place]. This information is not intended to replace advice given to you by your health care provider. Make sure you discuss any questions you have with your health care provider.

## 2014-09-09 NOTE — Progress Notes (Signed)
Pre visit review using our clinic review tool, if applicable. No additional management support is needed unless otherwise documented below in the visit note. 

## 2014-09-11 DIAGNOSIS — Z0000 Encounter for general adult medical examination without abnormal findings: Secondary | ICD-10-CM | POA: Insufficient documentation

## 2014-09-11 DIAGNOSIS — E669 Obesity, unspecified: Secondary | ICD-10-CM | POA: Insufficient documentation

## 2014-09-11 NOTE — Assessment & Plan Note (Signed)
Will check hemoglobin A1c, lipid panel. Advised her that she does need to lose some weight and she should increase her exercise and work on diet. She does not feel she needs a nutritionist to help her with this at this time.

## 2014-09-11 NOTE — Assessment & Plan Note (Signed)
Reminded about mammogram. She did request Pap smear and reminded her that she was not due for that was until 2016. She has had flu shot and is up-to-date on tetanus.

## 2014-09-11 NOTE — Progress Notes (Signed)
   Subjective:    Patient ID: Adriana Gonzalez, female    DOB: 07/04/1961, 53 y.o.   MRN: [id number]  HPI The patient is a 53 year old female comes in today to establish care. She does have past medical history significant for allergies, hyperplasia of her breast. She has had breast biopsy in the past. She's never been diagnosed with cancer. She also has morbid obesity and states that her diet is mediocre and she does not exercise very much. She denies any chest pains with exertion, shortness of breath, abdominal pain. She denies diarrhea or constipation. She denies any joint pain or swelling she denies any balance problems or fall.  Review of Systems  Constitutional: Negative for fever, activity change, appetite change, fatigue and unexpected weight change.  HENT: Negative.   Respiratory: Negative for cough, chest tightness, shortness of breath and wheezing.   Cardiovascular: Negative for chest pain, palpitations and leg swelling.  Gastrointestinal: Negative for nausea, abdominal pain, diarrhea, constipation and abdominal distention.  Musculoskeletal: Negative for myalgias, back pain and arthralgias.  Skin: Negative.   Neurological: Negative for dizziness, weakness, light-headedness and headaches.      Objective:   Physical Exam  Constitutional: She is oriented to person, place, and time. She appears well-developed and well-nourished.  Morbidly obese  HENT:  Head: Normocephalic and atraumatic.  Eyes: EOM are normal.  Neck: Normal range of motion.  Cardiovascular: Normal rate and regular rhythm.   Pulmonary/Chest: Effort normal and breath sounds normal. No respiratory distress. She has no wheezes. She has no rales.  Abdominal: Soft. Bowel sounds are normal. She exhibits no distension. There is no tenderness. There is no rebound.  Musculoskeletal: She exhibits no edema.  Neurological: She is alert and oriented to person, place, and time. Coordination normal.  Skin: Skin is warm and dry.     Filed Vitals:   09/09/14 1555  BP: 114/72  Pulse: 71  Temp: 98.2 F (36.8 C)  TempSrc: Oral  Resp: 14  Height: 5\' 2"  (1.575 m)  Weight: 194 lb 6.4 oz (88.179 kg)  SpO2: 97%      Assessment & Plan:

## 2014-09-17 ENCOUNTER — Telehealth: Payer: Self-pay | Admitting: Internal Medicine

## 2014-09-17 NOTE — Telephone Encounter (Signed)
Call pt at work [phone] or cell [phone] she is returning your call from 12/14

## 2014-09-18 ENCOUNTER — Other Ambulatory Visit: Payer: Self-pay | Admitting: Internal Medicine

## 2014-09-18 NOTE — Telephone Encounter (Signed)
Left message at work number for patient to call me back.

## 2014-09-19 NOTE — Telephone Encounter (Signed)
Spoke with patient.

## 2014-10-17 ENCOUNTER — Telehealth: Payer: Self-pay | Admitting: Internal Medicine

## 2014-10-17 ENCOUNTER — Other Ambulatory Visit: Payer: Self-pay | Admitting: Internal Medicine

## 2014-10-17 DIAGNOSIS — H9190 Unspecified hearing loss, unspecified ear: Secondary | ICD-10-CM

## 2014-10-17 NOTE — Telephone Encounter (Signed)
Patient requesting status of referral.  She was to be sent for hearing test.

## 2014-10-17 NOTE — Telephone Encounter (Signed)
Do you know anything about this? 

## 2014-10-17 NOTE — Telephone Encounter (Signed)
Order was placed on 09/09/14, can you check on it?

## 2014-10-28 ENCOUNTER — Encounter: Payer: Self-pay | Admitting: Internal Medicine

## 2014-11-12 ENCOUNTER — Telehealth: Payer: Self-pay | Admitting: Internal Medicine

## 2014-11-12 NOTE — Telephone Encounter (Signed)
Would like referral changed from [facility] to Dr. Steward Drone at [facility].

## 2014-11-18 NOTE — Telephone Encounter (Signed)
Faxed to [facility], awaiting appt

## 2014-12-31 ENCOUNTER — Encounter: Payer: Self-pay | Admitting: Internal Medicine

## 2014-12-31 ENCOUNTER — Ambulatory Visit (INDEPENDENT_AMBULATORY_CARE_PROVIDER_SITE_OTHER): Payer: 59 | Admitting: Internal Medicine

## 2014-12-31 VITALS — BP 130/82 | HR 74 | Temp 98.9°F | Resp 16 | Ht 62.0 in | Wt 197.6 lb

## 2014-12-31 DIAGNOSIS — J309 Allergic rhinitis, unspecified: Secondary | ICD-10-CM | POA: Insufficient documentation

## 2014-12-31 DIAGNOSIS — J3089 Other allergic rhinitis: Secondary | ICD-10-CM

## 2014-12-31 MED ORDER — FLUTICASONE PROPIONATE 50 MCG/ACT NA SUSP: 2.0000 | Freq: Every day | NASAL | Status: DC

## 2014-12-31 MED ORDER — HYDROCOD POLST-CHLORPHEN POLST 10-8 MG/5ML PO LQCR
5.0000 mL | Freq: Two times a day (BID) | ORAL | Status: DC | PRN
Start: 2014-12-31 — End: 2018-01-16

## 2014-12-31 NOTE — Patient Instructions (Signed)
We have sent in flonase which is a steroid that goes into the sinuses to dry them up. For the first 2-3 days use 2 puffs in each nostril twice a day. Then just use 2 puffs in each nostril once a day. This is the medicine you can add on and take while your allergies are bad.   We have also given you tussionex cough syrup which has some strong cough medicine in it which can make you drowsy. Be sure not to drive after taking it until you know how it will affect you.  If you are feeling no better or worse on Monday call us back and we may need to do some antibiotics.   Allergic Rhinitis Allergic rhinitis is when the mucous membranes in the nose respond to allergens. Allergens are particles in the air that cause your body to have an allergic reaction. This causes you to release allergic antibodies. Through a chain of events, these eventually cause you to release histamine into the blood stream. Although meant to protect the body, it is this release of histamine that causes your discomfort, such as frequent sneezing, congestion, and an itchy, runny nose.  CAUSES  Seasonal allergic rhinitis (hay fever) is caused by pollen allergens that may come from grasses, trees, and weeds. Year-round allergic rhinitis (perennial allergic rhinitis) is caused by allergens such as house dust mites, pet dander, and mold spores.  SYMPTOMS   Nasal stuffiness (congestion).  Itchy, runny nose with sneezing and tearing of the eyes. DIAGNOSIS  Your health care provider can help you determine the allergen or allergens that trigger your symptoms. If you and your health care provider are unable to determine the allergen, skin or blood testing may be used. TREATMENT  Allergic rhinitis does not have a cure, but it can be controlled by:  Medicines and allergy shots (immunotherapy).  Avoiding the allergen. Hay fever may often be treated with antihistamines in pill or nasal spray forms. Antihistamines block the effects of  histamine. There are over-the-counter medicines that may help with nasal congestion and swelling around the eyes. Check with your health care provider before taking or giving this medicine.  If avoiding the allergen or the medicine prescribed do not work, there are many new medicines your health care provider can prescribe. Stronger medicine may be used if initial measures are ineffective. Desensitizing injections can be used if medicine and avoidance does not work. Desensitization is when a patient is given ongoing shots until the body becomes less sensitive to the allergen. Make sure you follow up with your health care provider if problems continue. HOME CARE INSTRUCTIONS It is not possible to completely avoid allergens, but you can reduce your symptoms by taking steps to limit your exposure to them. It helps to know exactly what you are allergic to so that you can avoid your specific triggers. SEEK MEDICAL CARE IF:   You have a fever.  You develop a cough that does not stop easily (persistent).  You have shortness of breath.  You start wheezing.  Symptoms interfere with normal daily activities. Document Released: 06/14/2001 Document Revised: 09/24/2013 Document Reviewed: 05/27/2013 [employer] Patient Information 2015 [place], [place]. This information is not intended to replace advice given to you by your health care provider. Make sure you discuss any questions you have with your health care provider.

## 2014-12-31 NOTE — Progress Notes (Signed)
Pre visit review using our clinic review tool, if applicable. No additional management support is needed unless otherwise documented below in the visit note. 

## 2014-12-31 NOTE — Progress Notes (Signed)
   Subjective:    Patient ID: Adriana Gonzalez, female    DOB: [birth year], 54 y.o.   MRN: [id number]  HPI The patient is a 54 YO female who is coming in today for cough and congestion for 5 days. About the same since it started. She is taking claritin and has not noticed it to help much. She has also tried some over the counter cough medicine which is helped some. She denies fevers, chills, SOB. Has nasal and ear fullness. Denies headaches.   Review of Systems  Constitutional: Positive for fatigue. Negative for fever, chills, activity change and appetite change.  HENT: Positive for congestion, postnasal drip, rhinorrhea and sore throat. Negative for ear discharge, ear pain, facial swelling and sinus pressure.   Respiratory: Positive for cough. Negative for shortness of breath and wheezing.   Cardiovascular: Negative for chest pain, palpitations and leg swelling.  Gastrointestinal: Negative for nausea and diarrhea.  Musculoskeletal: Negative for myalgias.      Objective:   Physical Exam  Constitutional: She appears well-developed and well-nourished.  HENT:  Head: Normocephalic and atraumatic.  Right Ear: External ear normal.  Left Ear: External ear normal.  Nasal turbinates red and swollen with oropharynx with clear drainage and redness, no exudate or pus.   Eyes: EOM are normal.  Neck: Normal range of motion. Neck supple.  Cardiovascular: Normal rate and regular rhythm.   Pulmonary/Chest: Effort normal and breath sounds normal. No respiratory distress. She has no wheezes. She has no rales.   Filed Vitals:   12/31/14 0823  BP: 130/82  Pulse: 74  Temp: 98.9 F (37.2 C)  TempSrc: Oral  Resp: 16  Height: 5\' 2"  (1.575 m)  Weight: 197 lb 9.6 oz (89.631 kg)  SpO2: 95%      Assessment & Plan:

## 2014-12-31 NOTE — Assessment & Plan Note (Signed)
Cough likely from pollen and allergies. Adding flonase to her claritin. Also tussionex for cough and so she can sleep at night. If no improvement by Monday can consider antibiotics. No indication for antibiotics today. Advised to continue claritin. Given information about other OTC remedies that may help her feel better.

## 2015-05-15 ENCOUNTER — Other Ambulatory Visit (HOSPITAL_COMMUNITY)
Admission: RE | Admit: 2015-05-15 | Discharge: 2015-05-15 | Disposition: A | Discharge status: Home / Self Care | Payer: 59 | Source: Ambulatory Visit | Attending: Obstetrics and Gynecology | Admitting: Obstetrics and Gynecology

## 2015-05-15 ENCOUNTER — Other Ambulatory Visit: Payer: Self-pay | Admitting: Obstetrics and Gynecology

## 2015-05-15 DIAGNOSIS — Z1151 Encounter for screening for human papillomavirus (HPV): Secondary | ICD-10-CM | POA: Insufficient documentation

## 2015-05-15 DIAGNOSIS — Z01419 Encounter for gynecological examination (general) (routine) without abnormal findings: Secondary | ICD-10-CM | POA: Diagnosis present

## 2015-05-18 LAB — CYTOLOGY - PAP

## 2015-10-07 ENCOUNTER — Encounter: Payer: Self-pay | Admitting: Internal Medicine

## 2015-10-07 ENCOUNTER — Ambulatory Visit (INDEPENDENT_AMBULATORY_CARE_PROVIDER_SITE_OTHER): Payer: 59 | Admitting: Internal Medicine

## 2015-10-07 VITALS — BP 120/80 | HR 69 | Temp 97.9°F | Ht 62.0 in | Wt 201.0 lb

## 2015-10-07 DIAGNOSIS — M79609 Pain in unspecified limb: Secondary | ICD-10-CM

## 2015-10-07 DIAGNOSIS — R202 Paresthesia of skin: Secondary | ICD-10-CM | POA: Diagnosis not present

## 2015-10-07 DIAGNOSIS — M25521 Pain in right elbow: Secondary | ICD-10-CM | POA: Diagnosis not present

## 2015-10-07 NOTE — Progress Notes (Signed)
Subjective:    Patient ID: Adriana Gonzalez, female    DOB: [birth year], 55 y.o.   MRN: [id number]  HPI  Here to f/u with c/o 1 wk onset numbness to the index and middle finger right hand , worse at night, can wake her up, and now with pain and numbnes also to the arm to above the elbow with most pain now currently at the right lateral epicondylar area. Pt denies chest pain, increased sob or doe, wheezing, orthopnea, PND, increased LE swelling, palpitations, dizziness or syncope.  Pt denies new neurological symptoms such as new headache, or facial or extremity weakness or numbness   Pt denies polydipsia, polyuria,  Past Medical History  Diagnosis Date  . Atypical lobular hyperplasia of breast 2010   Past Surgical History  Procedure Laterality Date  . Breast lumpectomy w/ needle localization  2010    right  . Cesarean section  [place]    x3  . Tonsillectomy  1980    reports that she has never smoked. She has never used smokeless tobacco. She reports that she drinks about 1.8 oz of alcohol per week. She reports that she does not use illicit drugs. family history is negative for Colon cancer and Stomach cancer. Allergies  Allergen Reactions  . Penicillins Anaphylaxis  . Demerol Nausea Only  . Sulfa Antibiotics Rash   Current Outpatient Prescriptions on File Prior to Visit  Medication Sig Dispense Refill  . cholecalciferol (VITAMIN D) 1000 UNITS tablet Take 1,000 Units by mouth daily.    . fluticasone (FLONASE) 50 MCG/ACT nasal spray Place 2 sprays into both nostrils daily. 16 g 6  . loratadine (CLARITIN) 10 MG tablet Take 10 mg by mouth daily.    . Multiple Vitamin (MULTIVITAMIN) tablet Take 1 tablet by mouth. 3 times a week    . Omega-3 Fatty Acids (FISH OIL) 1000 MG CAPS Take by mouth. 4 times a week    . chlorpheniramine-HYDROcodone (TUSSIONEX PENNKINETIC ER) 10-8 MG/5ML LQCR Take 5 mLs by mouth every 12 (twelve) hours as needed for cough. (Patient not taking: Reported on  10/07/2015) 115 mL 0   No current facility-administered medications on file prior to visit.   Review of Systems  Constitutional: Negative for unusual diaphoresis or night sweats HENT: Negative for ringing in ear or discharge Eyes: Negative for double vision or worsening visual disturbance.  Respiratory: Negative for choking and stridor.   Gastrointestinal: Negative for vomiting or other signifcant bowel change Genitourinary: Negative for hematuria or change in urine volume.  Musculoskeletal: Negative for other MSK pain or swelling Skin: Negative for color change and worsening wound.  Neurological: Negative for tremors and numbness other than noted  Psychiatric/Behavioral: Negative for decreased concentration or agitation other than above       Objective:   Physical Exam BP 120/80 mmHg  Pulse 69  Temp(Src) 97.9 F (36.6 C) (Oral)  Ht 5\' 2"  (1.575 m)  Wt 201 lb (91.173 kg)  BMI 36.75 kg/m2  SpO2 97% VS noted,  Constitutional: Pt appears in no significant distress HENT: Head: NCAT.  Right Ear: External ear normal.  Left Ear: External ear normal.  Eyes: . Pupils are equal, round, and reactive to light. Conjunctivae and EOM are normal Neck: Normal range of motion. Neck supple.  Cardiovascular: Normal rate and regular rhythm.   Pulmonary/Chest: Effort normal and breath sounds without rales or wheezing.  Neurological: Pt is alert. Not confused , motor 5/5 intact, sens intact to LT to all  extremities, dtr symmetric Skin: Skin is warm. No rash, no LE edema Psychiatric: Pt behavior is normal. No agitation.  RUE with mild diffuse swelling/tender to lateral epicondylar     Assessment & Plan:

## 2015-10-07 NOTE — Patient Instructions (Signed)
Please wear the right wrist splint at night only until improved  Please wear the right elbow strap for "tennis elbow" during the day until improved  Please continue all other medications as before, including the ibuprofen  You can also take Tumeric OTC as well  Please have the pharmacy call with any other refills you may need.  Please keep your appointments with your specialists as you may have planned  Please see Dr Smith/sports medicine if not improved in 5-7 days

## 2015-10-07 NOTE — Progress Notes (Signed)
Pre visit review using our clinic review tool, if applicable. No additional management support is needed unless otherwise documented below in the visit note. 

## 2015-10-08 NOTE — Assessment & Plan Note (Signed)
C/w lateral right epicondylitis, for nsaid prn, rest, forearm band, consider f/u with sport medicine if not improved

## 2015-10-08 NOTE — Assessment & Plan Note (Signed)
Suspect CTS right vs ulnar neuritis, for trial right wrist splint at night, tylenol prn, to f/u any worsening symptoms or concerns

## 2015-10-15 DIAGNOSIS — Z1231 Encounter for screening mammogram for malignant neoplasm of breast: Secondary | ICD-10-CM | POA: Diagnosis not present

## 2015-10-15 MED FILL — FLUTICASONE PROP 50 MCG SPR: 50 | 30 days supply | Qty: 16 | Fill #5

## 2015-12-07 MED FILL — FLUTICASONE PROP 50 MCG SPR: 50 | 30 days supply | Qty: 16 | Fill #6

## 2015-12-11 MED FILL — AZITHROMYCIN 250 MG TABLET: 250 | 5 days supply | Qty: 6 | Fill #0

## 2016-01-14 ENCOUNTER — Other Ambulatory Visit: Payer: Self-pay | Admitting: Internal Medicine

## 2016-01-14 MED FILL — FLUTICASONE PROP 50 MCG SPR: 50 | 30 days supply | Qty: 16 | Fill #0

## 2016-01-28 MED FILL — CLOBETASOL 0.05% OINTMENT: 0.05 | 15 days supply | Qty: 30 | Fill #0

## 2016-03-03 MED FILL — FLUTICASONE PROP 50 MCG SPR: 50 | 30 days supply | Qty: 16 | Fill #1

## 2016-05-10 MED FILL — FLUTICASONE PROP 50 MCG SPR: 50 | 30 days supply | Qty: 16 | Fill #2

## 2016-05-18 ENCOUNTER — Other Ambulatory Visit: Payer: Self-pay | Admitting: Obstetrics and Gynecology

## 2016-05-18 ENCOUNTER — Other Ambulatory Visit (HOSPITAL_COMMUNITY)
Admission: RE | Admit: 2016-05-18 | Discharge: 2016-05-18 | Disposition: A | Discharge status: Home / Self Care | Payer: 59 | Source: Ambulatory Visit | Attending: Obstetrics and Gynecology | Admitting: Obstetrics and Gynecology

## 2016-05-18 DIAGNOSIS — Z01419 Encounter for gynecological examination (general) (routine) without abnormal findings: Secondary | ICD-10-CM | POA: Insufficient documentation

## 2016-05-18 DIAGNOSIS — N9089 Other specified noninflammatory disorders of vulva and perineum: Secondary | ICD-10-CM | POA: Diagnosis not present

## 2016-05-18 DIAGNOSIS — N763 Subacute and chronic vulvitis: Secondary | ICD-10-CM | POA: Diagnosis not present

## 2016-05-18 DIAGNOSIS — Z1151 Encounter for screening for human papillomavirus (HPV): Secondary | ICD-10-CM | POA: Insufficient documentation

## 2016-05-18 DIAGNOSIS — N951 Menopausal and female climacteric states: Secondary | ICD-10-CM | POA: Diagnosis not present

## 2016-05-18 DIAGNOSIS — Z01411 Encounter for gynecological examination (general) (routine) with abnormal findings: Secondary | ICD-10-CM | POA: Diagnosis not present

## 2016-05-20 LAB — CYTOLOGY - PAP

## 2016-06-30 MED FILL — FLUTICASONE PROP 50 MCG SPR: 50 | 30 days supply | Qty: 16 | Fill #3

## 2016-08-12 MED FILL — FLUTICASONE PROP 50 MCG SPR: 50 | 30 days supply | Qty: 16 | Fill #4

## 2016-09-15 MED FILL — FLUTICASONE PROP 50 MCG SPR: 50 | 30 days supply | Qty: 16 | Fill #5

## 2016-10-18 MED FILL — FLUTICASONE PROP 50 MCG SPR: 50 | 30 days supply | Qty: 16 | Fill #6

## 2016-11-25 MED FILL — FLUTICASONE PROP 50 MCG SPR: 50 | 30 days supply | Qty: 16 | Fill #7

## 2016-12-22 ENCOUNTER — Encounter (INDEPENDENT_AMBULATORY_CARE_PROVIDER_SITE_OTHER): Payer: Self-pay | Admitting: Family Medicine

## 2016-12-23 MED FILL — FLUTICASONE PROP 50 MCG SPR: 50 | 30 days supply | Qty: 16 | Fill #8

## 2017-02-03 ENCOUNTER — Other Ambulatory Visit: Payer: Self-pay | Admitting: Internal Medicine

## 2017-02-03 MED FILL — FLUTICASONE PROP 50 MCG SPR: 50 | 30 days supply | Qty: 16 | Fill #0

## 2017-02-07 DIAGNOSIS — Z1231 Encounter for screening mammogram for malignant neoplasm of breast: Secondary | ICD-10-CM | POA: Diagnosis not present

## 2017-02-28 MED FILL — FLUTICASONE PROP 50 MCG SPR: 50 | 30 days supply | Qty: 16 | Fill #1

## 2017-05-23 DIAGNOSIS — N951 Menopausal and female climacteric states: Secondary | ICD-10-CM | POA: Diagnosis not present

## 2017-05-23 DIAGNOSIS — N9089 Other specified noninflammatory disorders of vulva and perineum: Secondary | ICD-10-CM | POA: Diagnosis not present

## 2017-05-23 DIAGNOSIS — Z01411 Encounter for gynecological examination (general) (routine) with abnormal findings: Secondary | ICD-10-CM | POA: Diagnosis not present

## 2017-10-18 ENCOUNTER — Encounter: Payer: Self-pay | Admitting: Internal Medicine

## 2017-10-25 ENCOUNTER — Ambulatory Visit: Payer: Self-pay | Admitting: Emergency Medicine

## 2017-10-25 VITALS — BP 124/78 | HR 72 | Temp 98.4°F | Resp 17 | Wt 190.0 lb

## 2017-10-25 DIAGNOSIS — H6592 Unspecified nonsuppurative otitis media, left ear: Secondary | ICD-10-CM

## 2017-10-25 NOTE — Progress Notes (Signed)
Subjective:     Adriana Gonzalez is a 57 y.o. female who presents with ear pain and possible ear infection or wax buildup. Symptoms include: left ear pain and feeling of fullness. Onset of symptoms was 4 days ago, and have been unchanged since that time. Associated symptoms include: none.  Patient denies: chills, fever , non productive cough, post nasal drip, sinus pressure, sneezing and sore throat. She is drinking plenty of fluids.  The following portions of the patient's history were reviewed and updated as appropriate: allergies and current medications.  Review of Systems Pertinent items are noted in HPI.   Objective:    BP 124/78 (BP Location: Right Arm, Patient Position: Sitting, Cuff Size: Normal)   Pulse 72   Temp 98.4 F (36.9 C) (Oral)   Resp 17   Wt 190 lb (86.2 kg)   SpO2 98%   BMI 34.75 kg/m  General:  alert, cooperative and appears stated age  Right Ear: normal  Left Ear: Effusion noted, no erythema or purulence    Mouth:  lips, mucosa, and tongue normal; teeth and gums normal  Neck: no adenopathy and supple, symmetrical, trachea midline     Assessment:    Left acute otitis media with effusion  Plan:    Treatment: No antibiotics indicated at this time. Flonase or Rhinocort daily OTC analgesia as needed. Fluids, rest, avoid carbonated/alcoholic and caffeinated beverages.  Follow up in 1 week if not improving.

## 2017-10-25 NOTE — Patient Instructions (Signed)
There is no infection now, there is fluid buildup. I recommend Flonase two sprays, twice a day for one week, along with an antihistamine every day. If symptoms worsen or fail to resolve, contact the clinic or return for follow up.

## 2018-01-12 DIAGNOSIS — H40033 Anatomical narrow angle, bilateral: Secondary | ICD-10-CM | POA: Diagnosis not present

## 2018-01-12 DIAGNOSIS — T1511XA Foreign body in conjunctival sac, right eye, initial encounter: Secondary | ICD-10-CM | POA: Diagnosis not present

## 2018-01-16 ENCOUNTER — Encounter: Payer: Self-pay | Admitting: Internal Medicine

## 2018-01-16 ENCOUNTER — Other Ambulatory Visit (INDEPENDENT_AMBULATORY_CARE_PROVIDER_SITE_OTHER): Payer: 59

## 2018-01-16 ENCOUNTER — Ambulatory Visit (INDEPENDENT_AMBULATORY_CARE_PROVIDER_SITE_OTHER): Payer: 59 | Admitting: Internal Medicine

## 2018-01-16 VITALS — BP 110/80 | HR 62 | Temp 98.0°F | Ht 62.0 in | Wt 197.0 lb

## 2018-01-16 DIAGNOSIS — Z0000 Encounter for general adult medical examination without abnormal findings: Secondary | ICD-10-CM | POA: Diagnosis not present

## 2018-01-16 DIAGNOSIS — Z1159 Encounter for screening for other viral diseases: Secondary | ICD-10-CM

## 2018-01-16 DIAGNOSIS — Z6836 Body mass index (BMI) 36.0-36.9, adult: Secondary | ICD-10-CM

## 2018-01-16 DIAGNOSIS — E6609 Other obesity due to excess calories: Secondary | ICD-10-CM | POA: Diagnosis not present

## 2018-01-16 LAB — COMPREHENSIVE METABOLIC PANEL
ALT: 27 U/L (ref 0–35)
AST: 18 U/L (ref 0–37)
Albumin: 4.4 g/dL (ref 3.5–5.2)
Alkaline Phosphatase: 67 U/L (ref 39–117)
BUN: 13 mg/dL (ref 6–23)
CHLORIDE: 104 meq/L (ref 96–112)
CO2: 26 mEq/L (ref 19–32)
CREATININE: 0.84 mg/dL (ref 0.40–1.20)
Calcium: 9.5 mg/dL (ref 8.4–10.5)
GFR: 74.13 mL/min (ref >60.00)
GLUCOSE: 90 mg/dL (ref 70–99)
POTASSIUM: 4.3 meq/L (ref 3.5–5.1)
SODIUM: 137 meq/L (ref 135–145)
Total Bilirubin: 0.5 mg/dL (ref 0.2–1.2)
Total Protein: 6.9 g/dL (ref 6.0–8.3)

## 2018-01-16 LAB — LIPID PANEL
CHOL/HDL RATIO: 4
Cholesterol: 212 mg/dL — ABNORMAL HIGH (ref 0–200)
HDL: 52.7 mg/dL (ref >39.00)
LDL CALC: 139 mg/dL — AB (ref 0–99)
NONHDL: 159.32
Triglycerides: 100 mg/dL (ref 0.0–149.0)
VLDL: 20 mg/dL (ref 0.0–40.0)

## 2018-01-16 LAB — CBC
HEMATOCRIT: 43.7 % (ref 36.0–46.0)
Hemoglobin: 14.7 g/dL (ref 12.0–15.0)
MCHC: 33.6 g/dL (ref 30.0–36.0)
MCV: 88.3 fl (ref 78.0–100.0)
Platelets: 519 10*3/uL — ABNORMAL HIGH (ref 150.0–400.0)
RBC: 4.95 Mil/uL (ref 3.87–5.11)
RDW: 15.3 % (ref 11.5–15.5)
WBC: 7.7 10*3/uL (ref 4.0–10.5)

## 2018-01-16 LAB — HEMOGLOBIN A1C: HEMOGLOBIN A1C: 5.6 % (ref 4.6–6.5)

## 2018-01-16 LAB — VITAMIN D 25 HYDROXY (VIT D DEFICIENCY, FRACTURES): VITD: 45.91 ng/mL (ref 30.00–100.00)

## 2018-01-16 NOTE — Patient Instructions (Signed)
We will check the labs today.  Health Maintenance, Female Adopting a healthy lifestyle and getting preventive care can go a long way to promote health and wellness. Talk with your health care provider about what schedule of regular examinations is right for you. This is a good chance for you to check in with your provider about disease prevention and staying healthy. In between checkups, there are plenty of things you can do on your own. Experts have done a lot of research about which lifestyle changes and preventive measures are most likely to keep you healthy. Ask your health care provider for more information. Weight and diet Eat a healthy diet  Be sure to include plenty of vegetables, fruits, low-fat dairy products, and lean protein.  Do not eat a lot of foods high in solid fats, added sugars, or salt.  Get regular exercise. This is one of the most important things you can do for your health. ? Most adults should exercise for at least 150 minutes each week. The exercise should increase your heart rate and make you sweat (moderate-intensity exercise). ? Most adults should also do strengthening exercises at least twice a week. This is in addition to the moderate-intensity exercise.  Maintain a healthy weight  Body mass index (BMI) is a measurement that can be used to identify possible weight problems. It estimates body fat based on height and weight. Your health care provider can help determine your BMI and help you achieve or maintain a healthy weight.  For females 34 years of age and older: ? A BMI below 18.5 is considered underweight. ? A BMI of 18.5 to 24.9 is normal. ? A BMI of 25 to 29.9 is considered overweight. ? A BMI of 30 and above is considered obese.  Watch levels of cholesterol and blood lipids  You should start having your blood tested for lipids and cholesterol at 58 years of age, then have this test every 5 years.  You may need to have your cholesterol levels checked  more often if: ? Your lipid or cholesterol levels are high. ? You are older than 58 years of age. ? You are at high risk for heart disease.  Cancer screening Lung Cancer  Lung cancer screening is recommended for adults 50-85 years old who are at high risk for lung cancer because of a history of smoking.  A yearly low-dose CT scan of the lungs is recommended for people who: ? Currently smoke. ? Have quit within the past 15 years. ? Have at least a 30-pack-year history of smoking. A pack year is smoking an average of one pack of cigarettes a day for 1 year.  Yearly screening should continue until it has been 15 years since you quit.  Yearly screening should stop if you develop a health problem that would prevent you from having lung cancer treatment.  Breast Cancer  Practice breast self-awareness. This means understanding how your breasts normally appear and feel.  It also means doing regular breast self-exams. Let your health care provider know about any changes, no matter how small.  If you are in your 20s or 30s, you should have a clinical breast exam (CBE) by a health care provider every 1-3 years as part of a regular health exam.  If you are 47 or older, have a CBE every year. Also consider having a breast X-ray (mammogram) every year.  If you have a family history of breast cancer, talk to your health care provider about genetic screening.  If you are at high risk for breast cancer, talk to your health care provider about having an MRI and a mammogram every year.  Breast cancer gene (BRCA) assessment is recommended for women who have family members with BRCA-related cancers. BRCA-related cancers include: ? Breast. ? Ovarian. ? Tubal. ? Peritoneal cancers.  Results of the assessment will determine the need for genetic counseling and BRCA1 and BRCA2 testing.  Cervical Cancer Your health care provider may recommend that you be screened regularly for cancer of the pelvic  organs (ovaries, uterus, and vagina). This screening involves a pelvic examination, including checking for microscopic changes to the surface of your cervix (Pap test). You may be encouraged to have this screening done every 3 years, beginning at age 17.  For women ages 64-65, health care providers may recommend pelvic exams and Pap testing every 3 years, or they may recommend the Pap and pelvic exam, combined with testing for human papilloma virus (HPV), every 5 years. Some types of HPV increase your risk of cervical cancer. Testing for HPV may also be done on women of any age with unclear Pap test results.  Other health care providers may not recommend any screening for nonpregnant women who are considered low risk for pelvic cancer and who do not have symptoms. Ask your health care provider if a screening pelvic exam is right for you.  If you have had past treatment for cervical cancer or a condition that could lead to cancer, you need Pap tests and screening for cancer for at least 20 years after your treatment. If Pap tests have been discontinued, your risk factors (such as having a new sexual partner) need to be reassessed to determine if screening should resume. Some women have medical problems that increase the chance of getting cervical cancer. In these cases, your health care provider may recommend more frequent screening and Pap tests.  Colorectal Cancer  This type of cancer can be detected and often prevented.  Routine colorectal cancer screening usually begins at 58 years of age and continues through 58 years of age.  Your health care provider may recommend screening at an earlier age if you have risk factors for colon cancer.  Your health care provider may also recommend using home test kits to check for hidden blood in the stool.  A small camera at the end of a tube can be used to examine your colon directly (sigmoidoscopy or colonoscopy). This is done to check for the earliest forms  of colorectal cancer.  Routine screening usually begins at age 78.  Direct examination of the colon should be repeated every 5-10 years through 58 years of age. However, you may need to be screened more often if early forms of precancerous polyps or small growths are found.  Skin Cancer  Check your skin from head to toe regularly.  Tell your health care provider about any new moles or changes in moles, especially if there is a change in a mole's shape or color.  Also tell your health care provider if you have a mole that is larger than the size of a pencil eraser.  Always use sunscreen. Apply sunscreen liberally and repeatedly throughout the day.  Protect yourself by wearing long sleeves, pants, a wide-brimmed hat, and sunglasses whenever you are outside.  Heart disease, diabetes, and high blood pressure  High blood pressure causes heart disease and increases the risk of stroke. High blood pressure is more likely to develop in: ? People who have blood  pressure in the high end of the normal range (130-139/85-89 mm Hg). ? People who are overweight or obese. ? People who are African American.  If you are 65-57 years of age, have your blood pressure checked every 3-5 years. If you are 45 years of age or older, have your blood pressure checked every year. You should have your blood pressure measured twice-once when you are at a hospital or clinic, and once when you are not at a hospital or clinic. Record the average of the two measurements. To check your blood pressure when you are not at a hospital or clinic, you can use: ? An automated blood pressure machine at a pharmacy. ? A home blood pressure monitor.  If you are between 10 years and 29 years old, ask your health care provider if you should take aspirin to prevent strokes.  Have regular diabetes screenings. This involves taking a blood sample to check your fasting blood sugar level. ? If you are at a normal weight and have a low risk  for diabetes, have this test once every three years after 58 years of age. ? If you are overweight and have a high risk for diabetes, consider being tested at a younger age or more often. Preventing infection Hepatitis B  If you have a higher risk for hepatitis B, you should be screened for this virus. You are considered at high risk for hepatitis B if: ? You were born in a country where hepatitis B is common. Ask your health care provider which countries are considered high risk. ? Your parents were born in a high-risk country, and you have not been immunized against hepatitis B (hepatitis B vaccine). ? You have HIV or AIDS. ? You use needles to inject street drugs. ? You live with someone who has hepatitis B. ? You have had sex with someone who has hepatitis B. ? You get hemodialysis treatment. ? You take certain medicines for conditions, including cancer, organ transplantation, and autoimmune conditions.  Hepatitis C  Blood testing is recommended for: ? Everyone born from [birth year] through 1966. ? Anyone with known risk factors for hepatitis C.  Sexually transmitted infections (STIs)  You should be screened for sexually transmitted infections (STIs) including gonorrhea and chlamydia if: ? You are sexually active and are younger than 58 years of age. ? You are older than 58 years of age and your health care provider tells you that you are at risk for this type of infection. ? Your sexual activity has changed since you were last screened and you are at an increased risk for chlamydia or gonorrhea. Ask your health care provider if you are at risk.  If you do not have HIV, but are at risk, it may be recommended that you take a prescription medicine daily to prevent HIV infection. This is called pre-exposure prophylaxis (PrEP). You are considered at risk if: ? You are sexually active and do not regularly use condoms or know the HIV status of your partner(s). ? You take drugs by  injection. ? You are sexually active with a partner who has HIV.  Talk with your health care provider about whether you are at high risk of being infected with HIV. If you choose to begin PrEP, you should first be tested for HIV. You should then be tested every 3 months for as long as you are taking PrEP. Pregnancy  If you are premenopausal and you may become pregnant, ask your health care provider about preconception  counseling.  If you may become pregnant, take 400 to 800 micrograms (mcg) of folic acid every day.  If you want to prevent pregnancy, talk to your health care provider about birth control (contraception). Osteoporosis and menopause  Osteoporosis is a disease in which the bones lose minerals and strength with aging. This can result in serious bone fractures. Your risk for osteoporosis can be identified using a bone density scan.  If you are 54 years of age or older, or if you are at risk for osteoporosis and fractures, ask your health care provider if you should be screened.  Ask your health care provider whether you should take a calcium or vitamin D supplement to lower your risk for osteoporosis.  Menopause may have certain physical symptoms and risks.  Hormone replacement therapy may reduce some of these symptoms and risks. Talk to your health care provider about whether hormone replacement therapy is right for you. Follow these instructions at home:  Schedule regular health, dental, and eye exams.  Stay current with your immunizations.  Do not use any tobacco products including cigarettes, chewing tobacco, or electronic cigarettes.  If you are pregnant, do not drink alcohol.  If you are breastfeeding, limit how much and how often you drink alcohol.  Limit alcohol intake to no more than 1 drink per day for nonpregnant women. One drink equals 12 ounces of beer, 5 ounces of wine, or 1 ounces of hard liquor.  Do not use street drugs.  Do not share needles.  Ask  your health care provider for help if you need support or information about quitting drugs.  Tell your health care provider if you often feel depressed.  Tell your health care provider if you have ever been abused or do not feel safe at home. This information is not intended to replace advice given to you by your health care provider. Make sure you discuss any questions you have with your health care provider. Document Released: 04/04/2011 Document Revised: 02/25/2016 Document Reviewed: 06/23/2015 Elsevier Interactive Patient Education  [employer].

## 2018-01-16 NOTE — Assessment & Plan Note (Signed)
Weight is stable and she wants to make an effort at weight loss.

## 2018-01-16 NOTE — Assessment & Plan Note (Signed)
Flu shot yearly. Pneumonia not indicated. Shingrix added to waiting list. Colonoscopy scheduled with GI. Mammogram scheduled, pap smear scheduled and dexa scheduled. Counseled about sun safety and mole surveillance. Counseled about the dangers of distracted driving. Given 10 year screening recommendations.

## 2018-01-16 NOTE — Progress Notes (Signed)
   Subjective:    Patient ID: Mauricio Po, female    DOB: June 17, 1961, 57 y.o.   MRN: [id number]  HPI The patient is a 57 YO female coming in for physical. No new concerns. Wants to work on weight.  PMH, [facility], social history reviewed and updated.   Review of Systems  Constitutional: Negative.   HENT: Negative.   Eyes: Negative.   Respiratory: Negative for cough, chest tightness and shortness of breath.   Cardiovascular: Negative for chest pain, palpitations and leg swelling.  Gastrointestinal: Negative for abdominal distention, abdominal pain, constipation, diarrhea, nausea and vomiting.  Musculoskeletal: Negative.   Skin: Negative.   Neurological: Negative.   Psychiatric/Behavioral: Negative.       Objective:   Physical Exam  Constitutional: She is oriented to person, place, and time. She appears well-developed and well-nourished.  HENT:  Head: Normocephalic and atraumatic.  Eyes: EOM are normal.  Neck: Normal range of motion.  Cardiovascular: Normal rate and regular rhythm.  Pulmonary/Chest: Effort normal and breath sounds normal. No respiratory distress. She has no wheezes. She has no rales.  Abdominal: Soft. Bowel sounds are normal. She exhibits no distension. There is no tenderness. There is no rebound.  Musculoskeletal: She exhibits no edema.  Neurological: She is alert and oriented to person, place, and time. Coordination normal.  Skin: Skin is warm and dry.  Psychiatric: She has a normal mood and affect.   Vitals:   01/16/18 0940  BP: 110/80  Pulse: 62  Temp: 98 F (36.7 C)  TempSrc: Oral  SpO2: 98%  Weight: 197 lb (89.4 kg)  Height: 5\' 2"  (1.575 m)      Assessment & Plan:

## 2018-01-17 LAB — HEPATITIS C ANTIBODY
HEP C AB: NONREACTIVE
SIGNAL TO CUT-OFF: 0.01 (ref <1.00)

## 2018-02-21 DIAGNOSIS — H40033 Anatomical narrow angle, bilateral: Secondary | ICD-10-CM | POA: Diagnosis not present

## 2018-02-22 ENCOUNTER — Ambulatory Visit (INDEPENDENT_AMBULATORY_CARE_PROVIDER_SITE_OTHER): Payer: 59

## 2018-02-22 DIAGNOSIS — Z1231 Encounter for screening mammogram for malignant neoplasm of breast: Secondary | ICD-10-CM | POA: Diagnosis not present

## 2018-02-22 DIAGNOSIS — Z299 Encounter for prophylactic measures, unspecified: Secondary | ICD-10-CM

## 2018-02-22 LAB — HM MAMMOGRAPHY

## 2018-02-23 ENCOUNTER — Ambulatory Visit: Payer: Self-pay | Admitting: *Deleted

## 2018-02-23 ENCOUNTER — Encounter: Payer: Self-pay | Admitting: Internal Medicine

## 2018-02-23 NOTE — Progress Notes (Signed)
Abstracted and sent to scan  

## 2018-02-23 NOTE — Telephone Encounter (Signed)
Pt received the Shingrix vaccination yesterday. Now has some redness and swelling at the site. Warm to touch. Denies fever, nausea or itching.  Home care advice given to patient with verbal understanding. Will route to [facility] at [facility]. Will call back for worsening symptoms.  Reason for Disposition . Shingles (Herpes zoster; Zostavax) vaccine reactions  Answer Assessment - Initial Assessment Questions 1. SYMPTOMS: "What is the main symptom?" (e.g., redness, swelling, pain)      Some redness, some swelling and sore at the site 2. ONSET: "When was the vaccine (shot) given?" "How much later did the __________ begin?" (e.g., hours, days ago)      Yesterday injection was given, feeling results today 3. SEVERITY: "How bad is it?"      Little sore 4. FEVER: "Is there a fever?" If so, ask: "What is it, how was it measured, and when did it start?"      no 5. IMMUNIZATIONS GIVEN: "What shots have you recently received?"     shingrix 6. PAST REACTIONS: "Have you reacted to immunizations before?" If so, ask: "What happened?"     n/a 7. OTHER SYMPTOMS: "Do you have any other symptoms?"     no  Protocols used: IMMUNIZATION REACTIONS-A-AH

## 2018-02-27 NOTE — Telephone Encounter (Signed)
Can take tylenol if needed for pain or fevers. Use ice on site for swelling. Can take benadryl for itching. Sounds like normal vaccination reaction. Visit if site is worsening, pain uncontrolled with otcs.

## 2018-02-27 NOTE — Telephone Encounter (Signed)
Routing to dr crawford---do you want patient to come in so that we can look at reaction---or what recommendation do you have---please advise, I will call patient back --thanks

## 2018-02-27 NOTE — Telephone Encounter (Signed)
Patient states injection site reaction is much better now---only small area slightly raised, no fever,no nausea/vomiting---it sounds like typical vaccine reaction---no further intervention needed

## 2018-02-27 NOTE — Telephone Encounter (Signed)
Patient received shingrix at nurse visit. Please advise

## 2018-03-06 ENCOUNTER — Encounter: Payer: Self-pay | Admitting: Internal Medicine

## 2018-03-07 DIAGNOSIS — H40031 Anatomical narrow angle, right eye: Secondary | ICD-10-CM | POA: Diagnosis not present

## 2018-03-21 DIAGNOSIS — H40032 Anatomical narrow angle, left eye: Secondary | ICD-10-CM | POA: Diagnosis not present

## 2018-03-22 ENCOUNTER — Encounter: Payer: 59 | Admitting: Internal Medicine

## 2018-04-23 ENCOUNTER — Encounter: Payer: 59 | Admitting: Internal Medicine

## 2018-04-26 ENCOUNTER — Ambulatory Visit (INDEPENDENT_AMBULATORY_CARE_PROVIDER_SITE_OTHER): Payer: 59

## 2018-04-26 DIAGNOSIS — Z23 Encounter for immunization: Secondary | ICD-10-CM | POA: Diagnosis not present

## 2018-05-17 ENCOUNTER — Ambulatory Visit (AMBULATORY_SURGERY_CENTER): Payer: Self-pay | Admitting: *Deleted

## 2018-05-17 VITALS — Ht 62.0 in | Wt 191.2 lb

## 2018-05-17 DIAGNOSIS — Z86010 Personal history of colonic polyps: Secondary | ICD-10-CM

## 2018-05-17 NOTE — Progress Notes (Signed)
No egg or soy allergy known to patient  No issues with past sedation with any surgeries  or procedures, no intubation problems  No diet pills per patient No home 02 use per patient  No blood thinners per patient  Pt denies issues with constipation  No A fib or A flutter  EMMI video sent to pt's e mail  

## 2018-05-18 ENCOUNTER — Encounter: Payer: Self-pay | Admitting: Internal Medicine

## 2018-05-31 ENCOUNTER — Encounter: Payer: Self-pay | Admitting: Internal Medicine

## 2018-05-31 ENCOUNTER — Ambulatory Visit (AMBULATORY_SURGERY_CENTER): Payer: 59 | Admitting: Internal Medicine

## 2018-05-31 VITALS — BP 108/72 | HR 61 | Temp 98.4°F | Resp 11 | Ht 62.0 in | Wt 197.0 lb

## 2018-05-31 DIAGNOSIS — D125 Benign neoplasm of sigmoid colon: Secondary | ICD-10-CM

## 2018-05-31 DIAGNOSIS — Z86010 Personal history of colonic polyps: Secondary | ICD-10-CM | POA: Diagnosis not present

## 2018-05-31 DIAGNOSIS — Z1211 Encounter for screening for malignant neoplasm of colon: Secondary | ICD-10-CM | POA: Diagnosis not present

## 2018-05-31 DIAGNOSIS — K635 Polyp of colon: Secondary | ICD-10-CM | POA: Diagnosis not present

## 2018-05-31 MED ORDER — SODIUM CHLORIDE 0.9 % IV SOLN: 500.0000 mL | Freq: Once | INTRAVENOUS | Status: DC

## 2018-05-31 NOTE — Progress Notes (Signed)
To PACU, VSS. Report to [email or web address] 

## 2018-05-31 NOTE — Progress Notes (Signed)
Called to room to assist during endoscopic procedure.  Patient ID and intended procedure confirmed with present staff. Received instructions for my participation in the procedure from the performing physician.  

## 2018-05-31 NOTE — Progress Notes (Signed)
Pt's states no medical or surgical changes since previsit or office visit. 

## 2018-05-31 NOTE — Patient Instructions (Addendum)
   I found and removed one tiny polyp. I will let you know pathology results and when to have another routine colonoscopy by mail and/or My Chart.  I appreciate the opportunity to care for you. Gatha Mayer, MD, FACG YOU HAD AN ENDOSCOPIC PROCEDURE TODAY AT [facility]:   Refer to the procedure report that was given to you for any specific questions about what was found during the examination.  If the procedure report does not answer your questions, please call your gastroenterologist to clarify.  If you requested that your care partner not be given the details of your procedure findings, then the procedure report has been included in a sealed envelope for you to review at your convenience later.  YOU SHOULD EXPECT: Some feelings of bloating in the abdomen. Passage of more gas than usual.  Walking can help get rid of the air that was put into your GI tract during the procedure and reduce the bloating. If you had a lower endoscopy (such as a colonoscopy or flexible sigmoidoscopy) you may notice spotting of blood in your stool or on the toilet paper. If you underwent a bowel prep for your procedure, you may not have a normal bowel movement for a few days.  Please Note:  You might notice some irritation and congestion in your nose or some drainage.  This is from the oxygen used during your procedure.  There is no need for concern and it should clear up in a day or so.  SYMPTOMS TO REPORT IMMEDIATELY:   Following lower endoscopy (colonoscopy or flexible sigmoidoscopy):  Excessive amounts of blood in the stool  Significant tenderness or worsening of abdominal pains  Swelling of the abdomen that is new, acute  Fever of 100F or higher  For urgent or emergent issues, a gastroenterologist can be reached at any hour by calling [phone].   DIET:  We do recommend a small meal at first, but then you may proceed to your regular diet.  Drink plenty of fluids but you should  avoid alcoholic beverages for 24 hours.  ACTIVITY:  You should plan to take it easy for the rest of today and you should NOT DRIVE or use heavy machinery until tomorrow (because of the sedation medicines used during the test).    FOLLOW UP: Our staff will call the number listed on your records the next business day following your procedure to check on you and address any questions or concerns that you may have regarding the information given to you following your procedure. If we do not reach you, we will leave a message.  However, if you are feeling well and you are not experiencing any problems, there is no need to return our call.  We will assume that you have returned to your regular daily activities without incident.  If any biopsies were taken you will be contacted by phone or by letter within the next 1-3 weeks.  Please call us at [phone] if you have not heard about the biopsies in 3 weeks.   Await for biopsy results to determine for next repeat Colonoscopy screening Polyps (handout given)   SIGNATURES/CONFIDENTIALITY: You and/or your care partner have signed paperwork which will be entered into your electronic medical record.  These signatures attest to the fact that that the information above on your After Visit Summary has been reviewed and is understood.  Full responsibility of the confidentiality of this discharge information lies with you and/or your care-partner.

## 2018-05-31 NOTE — Op Note (Signed)
[place] Patient Name: Adriana Gonzalez Procedure Date: 05/31/2018 9:07 AM MRN: [id number] Endoscopist: Gatha Mayer , MD Age: 57 Referring MD:  Date of Birth: 1961-05-14 Gender: Female Account #: [id number] Procedure:                Colonoscopy Indications:              Surveillance: Personal history of adenomatous                            polyps on last colonoscopy > 5 years ago Medicines:                Propofol per Anesthesia, Monitored Anesthesia Care Procedure:                Pre-Anesthesia Assessment:                           - Prior to the procedure, a History and Physical                            was performed, and patient medications and                            allergies were reviewed. The patient's tolerance of                            previous anesthesia was also reviewed. The risks                            and benefits of the procedure and the sedation                            options and risks were discussed with the patient.                            All questions were answered, and informed consent                            was obtained. Prior Anticoagulants: The patient has                            taken no previous anticoagulant or antiplatelet                            agents. ASA Grade Assessment: II - A patient with                            mild systemic disease. After reviewing the risks                            and benefits, the patient was deemed in                            satisfactory condition to undergo the procedure.  After obtaining informed consent, the colonoscope                            was passed under direct vision. Throughout the                            procedure, the patient's blood pressure, pulse, and                            oxygen saturations were monitored continuously. The                            Colonoscope was introduced through the anus and   advanced to the the cecum, identified by                            appendiceal orifice and ileocecal valve. The                            colonoscopy was performed without difficulty. The                            patient tolerated the procedure well. The quality                            of the bowel preparation was excellent. The bowel                            preparation used was Miralax. The ileocecal valve,                            appendiceal orifice, and rectum were photographed. Scope In: 9:20:36 AM Scope Out: 9:34:24 AM Scope Withdrawal Time: 0 hours 10 minutes 47 seconds  Total Procedure Duration: 0 hours 13 minutes 48 seconds  Findings:                 The perianal and digital rectal examinations were                            normal.                           A diminutive polyp was found in the distal sigmoid                            colon. The polyp was sessile. The polyp was removed                            with a cold snare. Resection and retrieval were                            complete. Verification of patient identification                            for the specimen  was done. Estimated blood loss was                            minimal.                           The exam was otherwise without abnormality on                            direct and retroflexion views. Complications:            No immediate complications. Estimated Blood Loss:     Estimated blood loss was minimal. Impression:               - One diminutive polyp in the distal sigmoid colon,                            removed with a cold snare. Resected and retrieved.                           - The examination was otherwise normal on direct                            and retroflexion views.                           - Personal history of colonic polyp diminutive                            adenoma 09/2012. Recommendation:           - Patient has a contact number available for                             emergencies. The signs and symptoms of potential                            delayed complications were discussed with the                            patient. Return to normal activities tomorrow.                            Written discharge instructions were provided to the                            patient.                           - Resume previous diet.                           - Continue present medications.                           - Repeat colonoscopy is recommended. The  colonoscopy date will be determined after pathology                            results from today's exam become available for                            review. Gatha Mayer, MD 05/31/2018 9:41:50 AM This report has been signed electronically.

## 2018-06-05 ENCOUNTER — Telehealth: Payer: Self-pay

## 2018-06-05 NOTE — Telephone Encounter (Signed)
  Follow up Call-  Call back number 05/31/2018  Post procedure Call Back phone  # [phone]  Permission to leave phone message Yes  Some recent data might be hidden     Patient questions:  Do you have a fever, pain , or abdominal swelling? No. Pain Score  0 *  Have you tolerated food without any problems? Yes.    Have you been able to return to your normal activities? Yes.    Do you have any questions about your discharge instructions: Diet   No. Medications  No. Follow up visit  No.  Do you have questions or concerns about your Care? No.  Actions: * If pain score is 4 or above: No action needed, pain <4.  No problems noted per pt. maw

## 2018-06-07 ENCOUNTER — Encounter: Payer: Self-pay | Admitting: Internal Medicine

## 2018-06-07 NOTE — Progress Notes (Signed)
Sigmoid hyperplastic polyp Recall 10 yrs 2029 My Chart

## 2019-02-21 ENCOUNTER — Encounter: Payer: Self-pay | Admitting: Orthopaedic Surgery

## 2019-02-21 ENCOUNTER — Other Ambulatory Visit: Payer: Self-pay

## 2019-02-21 ENCOUNTER — Ambulatory Visit (INDEPENDENT_AMBULATORY_CARE_PROVIDER_SITE_OTHER): Payer: 59 | Admitting: Orthopaedic Surgery

## 2019-02-21 DIAGNOSIS — M65311 Trigger thumb, right thumb: Secondary | ICD-10-CM

## 2019-02-21 MED ORDER — BUPIVACAINE HCL 0.5 % IJ SOLN
0.3300 mL | INTRAMUSCULAR | Status: AC | PRN
  Administered 2019-02-21: 08:00:00 .33 mL

## 2019-02-21 MED ORDER — LIDOCAINE HCL 1 % IJ SOLN
0.3000 mL | INTRAMUSCULAR | Status: AC | PRN
  Administered 2019-02-21: 08:00:00 .3 mL

## 2019-02-21 MED ORDER — METHYLPREDNISOLONE ACETATE 40 MG/ML IJ SUSP
13.3300 mg | INTRAMUSCULAR | Status: AC | PRN
  Administered 2019-02-21: 13.33 mg

## 2019-02-21 NOTE — Progress Notes (Signed)
rigt tri   Office Visit Note   Patient: Adriana Gonzalez           Date of Birth: 1961-06-18           MRN: [id number] Visit Date: 02/21/2019              Requested by: Hoyt Koch, MD [place], [place] [id number] PCP: Hoyt Koch, MD   Assessment & Plan: Visit Diagnoses:  1. Trigger thumb, right thumb     Plan: Impression is right trigger thumb.  Cortisone injection performed today into the A1 pulley.  Patient tolerates well.  Patient is to take it easy over the next month or so with hand activity.  Questions encouraged and answered.  Follow-up as needed.  Follow-Up Instructions: Return if symptoms worsen or fail to improve.   Orders:  No orders of the defined types were placed in this encounter.  No orders of the defined types were placed in this encounter.     Procedures: Hand/UE Inj: R thumb A1 for trigger finger on 02/21/2019 8:27 AM Indications: pain Details: 25 G needle Medications: 0.3 mL lidocaine 1 %; 0.33 mL bupivacaine 0.5 %; 13.33 mg methylPREDNISolone acetate 40 MG/ML Outcome: tolerated well, no immediate complications Consent was given by the patient. Patient was prepped and draped in the usual sterile fashion.       Clinical Data: No additional findings.   Subjective: Chief Complaint  Patient presents with  . Right Thumb - Pain    Adriana Gonzalez is a very pleasant 58 year old female comes in for evaluation of her right thumb.  She states that she is started developing pain in her right thumb is accompanied by triggering in January when she helped somebody remove clothes off of a rack for about 8 hours so she did a lot of repetitive hand use.  Since then it has triggered more and has caused more pain.  The pain is localized near the finger palmar flexion crease of the thumb.  It is slightly tender to palpation.   Review of Systems  Constitutional: Negative.   HENT: Negative.   Eyes: Negative.   Respiratory: Negative.    Cardiovascular: Negative.   Endocrine: Negative.   Musculoskeletal: Negative.   Neurological: Negative.   Hematological: Negative.   Psychiatric/Behavioral: Negative.   All other systems reviewed and are negative.    Objective: Vital Signs: There were no vitals taken for this visit.  Physical Exam Vitals signs and nursing note reviewed.  Constitutional:      Appearance: She is well-developed.  HENT:     Head: Normocephalic and atraumatic.  Neck:     Musculoskeletal: Neck supple.  Pulmonary:     Effort: Pulmonary effort is normal.  Abdominal:     Palpations: Abdomen is soft.  Skin:    General: Skin is warm.     Capillary Refill: Capillary refill takes less than 2 seconds.  Neurological:     Mental Status: She is alert and oriented to person, place, and time.  Psychiatric:        Behavior: Behavior normal.        Thought Content: Thought content normal.        Judgment: Judgment normal.    Ortho exam Right hand exam shows a tender A1 pulley in line with the thumb.  There is active triggering.  Otherwise hand exam is unremarkable.  Specialty Comments:  No specialty comments available.  Imaging: No results found.   [place]  History: Patient Active Problem List   Diagnosis Date Noted  . Trigger thumb, right thumb 02/21/2019  . Paresthesia and pain of right extremity 10/07/2015  . Allergic rhinitis 12/31/2014  . Obesity 09/11/2014  . Routine general medical examination at a health care facility 09/11/2014  . Personal history of colonic adenomas 10/10/2012  . Atypical lobular hyperplasia of breast 01/27/2012   Past Medical History:  Diagnosis Date  . Adenomatous colon polyp 2014  . Allergy   . Anemia   . Atypical lobular hyperplasia of breast 2010    Family History  Problem Relation Age of Onset  . Colon cancer Neg Hx   . Stomach cancer Neg Hx   . Colon polyps Neg Hx   . Esophageal cancer Neg Hx   . Rectal cancer Neg Hx   . Pancreatic cancer Neg Hx      Past Surgical History:  Procedure Laterality Date  . BREAST LUMPECTOMY W/ NEEDLE LOCALIZATION  2010   right  . [place]   x3  . COLONOSCOPY    . TONSILLECTOMY  1980   Social History   Occupational History    Employer: [employer]  Tobacco Use  . Smoking status: Never Smoker  . Smokeless tobacco: Never Used  Substance and Sexual Activity  . Alcohol use: Yes    Alcohol/week: 3.0 standard drinks    Types: 3 Standard drinks or equivalent per week    Comment: occasional  . Drug use: No  . Sexual activity: Yes

## 2019-03-02 ENCOUNTER — Other Ambulatory Visit: Payer: 59

## 2019-03-02 ENCOUNTER — Other Ambulatory Visit: Payer: Self-pay | Admitting: Hematology

## 2019-03-02 DIAGNOSIS — Z20822 Contact with and (suspected) exposure to covid-19: Secondary | ICD-10-CM

## 2019-03-02 NOTE — Progress Notes (Signed)
Possible exposure.

## 2019-03-04 LAB — NOVEL CORONAVIRUS, NAA: SARS-CoV-2, NAA: NOT DETECTED

## 2019-04-15 ENCOUNTER — Encounter: Payer: Self-pay | Admitting: Internal Medicine

## 2019-04-15 ENCOUNTER — Ambulatory Visit (INDEPENDENT_AMBULATORY_CARE_PROVIDER_SITE_OTHER): Payer: 59 | Admitting: Internal Medicine

## 2019-04-15 ENCOUNTER — Other Ambulatory Visit: Payer: Self-pay

## 2019-04-15 ENCOUNTER — Other Ambulatory Visit (INDEPENDENT_AMBULATORY_CARE_PROVIDER_SITE_OTHER): Payer: 59

## 2019-04-15 VITALS — BP 130/90 | HR 73 | Temp 98.4°F | Ht 62.0 in | Wt 190.0 lb

## 2019-04-15 DIAGNOSIS — Z0000 Encounter for general adult medical examination without abnormal findings: Secondary | ICD-10-CM

## 2019-04-15 DIAGNOSIS — Z6834 Body mass index (BMI) 34.0-34.9, adult: Secondary | ICD-10-CM | POA: Diagnosis not present

## 2019-04-15 DIAGNOSIS — E6609 Other obesity due to excess calories: Secondary | ICD-10-CM | POA: Diagnosis not present

## 2019-04-15 LAB — COMPREHENSIVE METABOLIC PANEL
ALT: 25 U/L (ref 0–35)
AST: 18 U/L (ref 0–37)
Albumin: 4.6 g/dL (ref 3.5–5.2)
Alkaline Phosphatase: 67 U/L (ref 39–117)
BUN: 18 mg/dL (ref 6–23)
CO2: 26 mEq/L (ref 19–32)
Calcium: 9.8 mg/dL (ref 8.4–10.5)
Chloride: 104 mEq/L (ref 96–112)
Creatinine, Ser: 0.85 mg/dL (ref 0.40–1.20)
GFR: 68.51 mL/min (ref >60.00)
Glucose, Bld: 95 mg/dL (ref 70–99)
Potassium: 4 mEq/L (ref 3.5–5.1)
Sodium: 139 mEq/L (ref 135–145)
Total Bilirubin: 0.4 mg/dL (ref 0.2–1.2)
Total Protein: 6.9 g/dL (ref 6.0–8.3)

## 2019-04-15 LAB — LIPID PANEL
Cholesterol: 204 mg/dL — ABNORMAL HIGH (ref 0–200)
HDL: 53.2 mg/dL (ref >39.00)
LDL Cholesterol: 130 mg/dL — ABNORMAL HIGH (ref 0–99)
NonHDL: 150.37
Total CHOL/HDL Ratio: 4
Triglycerides: 102 mg/dL (ref 0.0–149.0)
VLDL: 20.4 mg/dL (ref 0.0–40.0)

## 2019-04-15 LAB — CBC
HCT: 43.4 % (ref 36.0–46.0)
Hemoglobin: 14.4 g/dL (ref 12.0–15.0)
MCHC: 33 g/dL (ref 30.0–36.0)
MCV: 89.4 fl (ref 78.0–100.0)
Platelets: 554 10*3/uL — ABNORMAL HIGH (ref 150.0–400.0)
RBC: 4.86 Mil/uL (ref 3.87–5.11)
RDW: 15.3 % (ref 11.5–15.5)
WBC: 7.3 10*3/uL (ref 4.0–10.5)

## 2019-04-15 LAB — HEMOGLOBIN A1C: Hgb A1c MFr Bld: 5.7 % (ref 4.6–6.5)

## 2019-04-15 MED ORDER — NYSTATIN-TRIAMCINOLONE 100000-0.1 UNIT/GM-% EX OINT: 1.0000 "application " | TOPICAL_OINTMENT | Freq: Two times a day (BID) | CUTANEOUS | 0 refills | Status: AC

## 2019-04-15 MED FILL — NYSTATIN-TRIAMCINOLONE OINT: 100000-0.1 | 30 days supply | Qty: 90 | Fill #0

## 2019-04-15 NOTE — Patient Instructions (Signed)
Health Maintenance, Female Adopting a healthy lifestyle and getting preventive care are important in promoting health and wellness. Ask your health care provider about:  The right schedule for you to have regular tests and exams.  Things you can do on your own to prevent diseases and keep yourself healthy. What should I know about diet, weight, and exercise? Eat a healthy diet   Eat a diet that includes plenty of vegetables, fruits, low-fat dairy products, and lean protein.  Do not eat a lot of foods that are high in solid fats, added sugars, or sodium. Maintain a healthy weight Body mass index (BMI) is used to identify weight problems. It estimates body fat based on height and weight. Your health care provider can help determine your BMI and help you achieve or maintain a healthy weight. Get regular exercise Get regular exercise. This is one of the most important things you can do for your health. Most adults should:  Exercise for at least 150 minutes each week. The exercise should increase your heart rate and make you sweat (moderate-intensity exercise).  Do strengthening exercises at least twice a week. This is in addition to the moderate-intensity exercise.  Spend less time sitting. Even light physical activity can be beneficial. Watch cholesterol and blood lipids Have your blood tested for lipids and cholesterol at 59 years of age, then have this test every 5 years. Have your cholesterol levels checked more often if:  Your lipid or cholesterol levels are high.  You are older than 59 years of age.  You are at high risk for heart disease. What should I know about cancer screening? Depending on your health history and family history, you may need to have cancer screening at various ages. This may include screening for:  Breast cancer.  Cervical cancer.  Colorectal cancer.  Skin cancer.  Lung cancer. What should I know about heart disease, diabetes, and high blood  pressure? Blood pressure and heart disease  High blood pressure causes heart disease and increases the risk of stroke. This is more likely to develop in people who have high blood pressure readings, are of African descent, or are overweight.  Have your blood pressure checked: ? Every 3-5 years if you are 18-39 years of age. ? Every year if you are 40 years old or older. Diabetes Have regular diabetes screenings. This checks your fasting blood sugar level. Have the screening done:  Once every three years after age 40 if you are at a normal weight and have a low risk for diabetes.  More often and at a younger age if you are overweight or have a high risk for diabetes. What should I know about preventing infection? Hepatitis B If you have a higher risk for hepatitis B, you should be screened for this virus. Talk with your health care provider to find out if you are at risk for hepatitis B infection. Hepatitis C Testing is recommended for:  Everyone born from 1945 through 1965.  Anyone with known risk factors for hepatitis C. Sexually transmitted infections (STIs)  Get screened for STIs, including gonorrhea and chlamydia, if: ? You are sexually active and are younger than 59 years of age. ? You are older than 59 years of age and your health care provider tells you that you are at risk for this type of infection. ? Your sexual activity has changed since you were last screened, and you are at increased risk for chlamydia or gonorrhea. Ask your health care provider if   you are at risk.  Ask your health care provider about whether you are at high risk for HIV. Your health care provider may recommend a prescription medicine to help prevent HIV infection. If you choose to take medicine to prevent HIV, you should first get tested for HIV. You should then be tested every 3 months for as long as you are taking the medicine. Pregnancy  If you are about to stop having your period (premenopausal) and  you may become pregnant, seek counseling before you get pregnant.  Take 400 to 800 micrograms (mcg) of folic acid every day if you become pregnant.  Ask for birth control (contraception) if you want to prevent pregnancy. Osteoporosis and menopause Osteoporosis is a disease in which the bones lose minerals and strength with aging. This can result in bone fractures. If you are 65 years old or older, or if you are at risk for osteoporosis and fractures, ask your health care provider if you should:  Be screened for bone loss.  Take a calcium or vitamin D supplement to lower your risk of fractures.  Be given hormone replacement therapy (HRT) to treat symptoms of menopause. Follow these instructions at home: Lifestyle  Do not use any products that contain nicotine or tobacco, such as cigarettes, e-cigarettes, and chewing tobacco. If you need help quitting, ask your health care provider.  Do not use street drugs.  Do not share needles.  Ask your health care provider for help if you need support or information about quitting drugs. Alcohol use  Do not drink alcohol if: ? Your health care provider tells you not to drink. ? You are pregnant, may be pregnant, or are planning to become pregnant.  If you drink alcohol: ? Limit how much you use to 0-1 drink a day. ? Limit intake if you are breastfeeding.  Be aware of how much alcohol is in your drink. In the [place] one drink equals one 12 oz bottle of beer (355 mL), one 5 oz glass of wine (148 mL), or one 1 oz glass of hard liquor (44 mL). General instructions  Schedule regular health, dental, and eye exams.  Stay current with your vaccines.  Tell your health care provider if: ? You often feel depressed. ? You have ever been abused or do not feel safe at home. Summary  Adopting a healthy lifestyle and getting preventive care are important in promoting health and wellness.  Follow your health care provider's instructions about healthy  diet, exercising, and getting tested or screened for diseases.  Follow your health care provider's instructions on monitoring your cholesterol and blood pressure. This information is not intended to replace advice given to you by your health care provider. Make sure you discuss any questions you have with your health care provider. Document Released: 04/04/2011 Document Revised: 09/12/2018 Document Reviewed: 09/12/2018 Elsevier Patient Education  2020 Elsevier Inc.  

## 2019-04-15 NOTE — Assessment & Plan Note (Signed)
Flu shot yearly. Shingrix complete. Tetanus up to date. Colonoscopy up to date. Mammogram up to date, pap smear up to date with gyn. Counseled about sun safety and mole surveillance. Counseled about the dangers of distracted driving. Given 10 year screening recommendations.

## 2019-04-15 NOTE — Progress Notes (Signed)
   Subjective:   Patient ID: Adriana Gonzalez, female    DOB: [birth year], 59 y.o.   MRN: [id number]  HPI The patient is a 58 YO female coming in for physical.   PMH, [place], social history reviewed and updated  Review of Systems  Constitutional: Negative.   HENT: Negative.   Eyes: Negative.   Respiratory: Negative for cough, chest tightness and shortness of breath.   Cardiovascular: Negative for chest pain, palpitations and leg swelling.  Gastrointestinal: Negative for abdominal distention, abdominal pain, constipation, diarrhea, nausea and vomiting.  Musculoskeletal: Negative.   Skin: Negative.   Neurological: Negative.   Psychiatric/Behavioral: Negative.     Objective:  Physical Exam Constitutional:      Appearance: She is well-developed. She is obese.  HENT:     Head: Normocephalic and atraumatic.  Neck:     Musculoskeletal: Normal range of motion.  Cardiovascular:     Rate and Rhythm: Normal rate and regular rhythm.  Pulmonary:     Effort: Pulmonary effort is normal. No respiratory distress.     Breath sounds: Normal breath sounds. No wheezing or rales.  Abdominal:     General: Bowel sounds are normal. There is no distension.     Palpations: Abdomen is soft.     Tenderness: There is no abdominal tenderness. There is no rebound.  Skin:    General: Skin is warm and dry.  Neurological:     Mental Status: She is alert and oriented to person, place, and time.     Coordination: Coordination normal.     Vitals:   04/15/19 0759  BP: 130/90  Pulse: 73  Temp: 98.4 F (36.9 C)  TempSrc: Oral  SpO2: 96%  Weight: 190 lb (86.2 kg)  Height: 5\' 2"  (1.575 m)    Assessment & Plan:

## 2019-04-15 NOTE — Assessment & Plan Note (Signed)
Discussed weight management. She is down 9 pounds since last year and encouraged continued changes including adding exercise.

## 2019-05-03 DIAGNOSIS — Z1231 Encounter for screening mammogram for malignant neoplasm of breast: Secondary | ICD-10-CM | POA: Diagnosis not present

## 2020-05-15 MED FILL — CLINDAMYCIN HCL 300 MG CAPS: 300 | 5 days supply | Qty: 20 | Fill #0

## 2020-05-15 MED FILL — CHLORHEXIDINE 0.12% RINSE: 0.12 | 17 days supply | Qty: 473 | Fill #0

## 2020-05-18 DIAGNOSIS — H52223 Regular astigmatism, bilateral: Secondary | ICD-10-CM | POA: Diagnosis not present

## 2020-05-18 DIAGNOSIS — H5203 Hypermetropia, bilateral: Secondary | ICD-10-CM | POA: Diagnosis not present

## 2020-05-18 DIAGNOSIS — H524 Presbyopia: Secondary | ICD-10-CM | POA: Diagnosis not present

## 2020-05-21 DIAGNOSIS — Z1231 Encounter for screening mammogram for malignant neoplasm of breast: Secondary | ICD-10-CM | POA: Diagnosis not present

## 2021-01-25 ENCOUNTER — Other Ambulatory Visit: Payer: Self-pay

## 2021-01-26 ENCOUNTER — Encounter: Payer: Self-pay | Admitting: Internal Medicine

## 2021-01-26 ENCOUNTER — Ambulatory Visit (INDEPENDENT_AMBULATORY_CARE_PROVIDER_SITE_OTHER): Payer: 59 | Admitting: Internal Medicine

## 2021-01-26 VITALS — BP 118/78 | HR 66 | Temp 98.4°F | Resp 18 | Ht 62.0 in | Wt 190.8 lb

## 2021-01-26 DIAGNOSIS — N6099 Unspecified benign mammary dysplasia of unspecified breast: Secondary | ICD-10-CM | POA: Diagnosis not present

## 2021-01-26 DIAGNOSIS — Z0000 Encounter for general adult medical examination without abnormal findings: Secondary | ICD-10-CM | POA: Diagnosis not present

## 2021-01-26 LAB — COMPREHENSIVE METABOLIC PANEL
ALT: 31 U/L (ref 0–35)
AST: 25 U/L (ref 0–37)
Albumin: 4.5 g/dL (ref 3.5–5.2)
Alkaline Phosphatase: 63 U/L (ref 39–117)
BUN: 13 mg/dL (ref 6–23)
CO2: 27 mEq/L (ref 19–32)
Calcium: 10 mg/dL (ref 8.4–10.5)
Chloride: 103 mEq/L (ref 96–112)
Creatinine, Ser: 0.83 mg/dL (ref 0.40–1.20)
GFR: 76.54 mL/min (ref >60.00)
Glucose, Bld: 85 mg/dL (ref 70–99)
Potassium: 4.3 mEq/L (ref 3.5–5.1)
Sodium: 136 mEq/L (ref 135–145)
Total Bilirubin: 0.3 mg/dL (ref 0.2–1.2)
Total Protein: 7.3 g/dL (ref 6.0–8.3)

## 2021-01-26 LAB — LIPID PANEL
Cholesterol: 216 mg/dL — ABNORMAL HIGH (ref 0–200)
HDL: 56.2 mg/dL (ref >39.00)
LDL Cholesterol: 131 mg/dL — ABNORMAL HIGH (ref 0–99)
NonHDL: 159.88
Total CHOL/HDL Ratio: 4
Triglycerides: 142 mg/dL (ref 0.0–149.0)
VLDL: 28.4 mg/dL (ref 0.0–40.0)

## 2021-01-26 LAB — CBC
HCT: 44.1 % (ref 36.0–46.0)
Hemoglobin: 14.5 g/dL (ref 12.0–15.0)
MCHC: 32.9 g/dL (ref 30.0–36.0)
MCV: 88.5 fl (ref 78.0–100.0)
Platelets: 537 10*3/uL — ABNORMAL HIGH (ref 150.0–400.0)
RBC: 4.99 Mil/uL (ref 3.87–5.11)
RDW: 15.9 % — ABNORMAL HIGH (ref 11.5–15.5)
WBC: 8.3 10*3/uL (ref 4.0–10.5)

## 2021-01-26 LAB — HEMOGLOBIN A1C: Hgb A1c MFr Bld: 5.7 % (ref 4.6–6.5)

## 2021-01-26 NOTE — Progress Notes (Signed)
   Subjective:   Patient ID: Adriana Gonzalez, female    DOB: 1961/01/10, 60 y.o.   MRN: [id number]  HPI The patient is a 60 YO female coming in for physical.   [facility] for mammogram, [facility] for pap smear  PMH, [facility], social history reviewed and updated  Review of Systems  Constitutional: Negative.   HENT: Negative.   Eyes: Negative.   Respiratory: Negative for cough, chest tightness and shortness of breath.   Cardiovascular: Negative for chest pain, palpitations and leg swelling.  Gastrointestinal: Negative for abdominal distention, abdominal pain, constipation, diarrhea, nausea and vomiting.  Musculoskeletal: Negative.   Skin: Negative.   Neurological: Negative.   Psychiatric/Behavioral: Negative.     Objective:  Physical Exam Constitutional:      Appearance: She is well-developed.  HENT:     Head: Normocephalic and atraumatic.  Cardiovascular:     Rate and Rhythm: Normal rate and regular rhythm.  Pulmonary:     Effort: Pulmonary effort is normal. No respiratory distress.     Breath sounds: Normal breath sounds. No wheezing or rales.  Abdominal:     General: Bowel sounds are normal. There is no distension.     Palpations: Abdomen is soft.     Tenderness: There is no abdominal tenderness. There is no rebound.  Musculoskeletal:     Cervical back: Normal range of motion.  Skin:    General: Skin is warm and dry.  Neurological:     Mental Status: She is alert and oriented to person, place, and time.     Coordination: Coordination normal.     Vitals:   01/26/21 1257  BP: 118/78  Pulse: 66  Resp: 18  Temp: 98.4 F (36.9 C)  TempSrc: Oral  SpO2: 98%  Weight: 190 lb 12.8 oz (86.5 kg)  Height: 5\' 2"  (1.575 m)    This visit occurred during the SARS-CoV-2 public health emergency.  Safety protocols were in place, including screening questions prior to the visit, additional usage of staff PPE, and extensive cleaning of exam room while observing appropriate contact time as  indicated for disinfecting solutions.   Assessment & Plan:

## 2021-01-26 NOTE — Patient Instructions (Addendum)
Vaccines.gov  Health Maintenance, Female Adopting a healthy lifestyle and getting preventive care are important in promoting health and wellness. Ask your health care provider about:  The right schedule for you to have regular tests and exams.  Things you can do on your own to prevent diseases and keep yourself healthy. What should I know about diet, weight, and exercise? Eat a healthy diet  Eat a diet that includes plenty of vegetables, fruits, low-fat dairy products, and lean protein.  Do not eat a lot of foods that are high in solid fats, added sugars, or sodium.   Maintain a healthy weight Body mass index (BMI) is used to identify weight problems. It estimates body fat based on height and weight. Your health care provider can help determine your BMI and help you achieve or maintain a healthy weight. Get regular exercise Get regular exercise. This is one of the most important things you can do for your health. Most adults should:  Exercise for at least 150 minutes each week. The exercise should increase your heart rate and make you sweat (moderate-intensity exercise).  Do strengthening exercises at least twice a week. This is in addition to the moderate-intensity exercise.  Spend less time sitting. Even light physical activity can be beneficial. Watch cholesterol and blood lipids Have your blood tested for lipids and cholesterol at 61 years of age, then have this test every 5 years. Have your cholesterol levels checked more often if:  Your lipid or cholesterol levels are high.  You are older than 61 years of age.  You are at high risk for heart disease. What should I know about cancer screening? Depending on your health history and family history, you may need to have cancer screening at various ages. This may include screening for:  Breast cancer.  Cervical cancer.  Colorectal cancer.  Skin cancer.  Lung cancer. What should I know about heart disease, diabetes, and  high blood pressure? Blood pressure and heart disease  High blood pressure causes heart disease and increases the risk of stroke. This is more likely to develop in people who have high blood pressure readings, are of African descent, or are overweight.  Have your blood pressure checked: ? Every 3-5 years if you are 79-11 years of age. ? Every year if you are 5 years old or older. Diabetes Have regular diabetes screenings. This checks your fasting blood sugar level. Have the screening done:  Once every three years after age 10 if you are at a normal weight and have a low risk for diabetes.  More often and at a younger age if you are overweight or have a high risk for diabetes. What should I know about preventing infection? Hepatitis B If you have a higher risk for hepatitis B, you should be screened for this virus. Talk with your health care provider to find out if you are at risk for hepatitis B infection. Hepatitis C Testing is recommended for:  Everyone born from [birth year] through 1966.  Anyone with known risk factors for hepatitis C. Sexually transmitted infections (STIs)  Get screened for STIs, including gonorrhea and chlamydia, if: ? You are sexually active and are younger than 61 years of age. ? You are older than 61 years of age and your health care provider tells you that you are at risk for this type of infection. ? Your sexual activity has changed since you were last screened, and you are at increased risk for chlamydia or gonorrhea. Ask your health  care provider if you are at risk.  Ask your health care provider about whether you are at high risk for HIV. Your health care provider may recommend a prescription medicine to help prevent HIV infection. If you choose to take medicine to prevent HIV, you should first get tested for HIV. You should then be tested every 3 months for as long as you are taking the medicine. Pregnancy  If you are about to stop having your period  (premenopausal) and you may become pregnant, seek counseling before you get pregnant.  Take 400 to 800 micrograms (mcg) of folic acid every day if you become pregnant.  Ask for birth control (contraception) if you want to prevent pregnancy. Osteoporosis and menopause Osteoporosis is a disease in which the bones lose minerals and strength with aging. This can result in bone fractures. If you are 19 years old or older, or if you are at risk for osteoporosis and fractures, ask your health care provider if you should:  Be screened for bone loss.  Take a calcium or vitamin D supplement to lower your risk of fractures.  Be given hormone replacement therapy (HRT) to treat symptoms of menopause. Follow these instructions at home: Lifestyle  Do not use any products that contain nicotine or tobacco, such as cigarettes, e-cigarettes, and chewing tobacco. If you need help quitting, ask your health care provider.  Do not use street drugs.  Do not share needles.  Ask your health care provider for help if you need support or information about quitting drugs. Alcohol use  Do not drink alcohol if: ? Your health care provider tells you not to drink. ? You are pregnant, may be pregnant, or are planning to become pregnant.  If you drink alcohol: ? Limit how much you use to 0-1 drink a day. ? Limit intake if you are breastfeeding.  Be aware of how much alcohol is in your drink. In the [place] one drink equals one 12 oz bottle of beer (355 mL), one 5 oz glass of wine (148 mL), or one 1 oz glass of hard liquor (44 mL). General instructions  Schedule regular health, dental, and eye exams.  Stay current with your vaccines.  Tell your health care provider if: ? You often feel depressed. ? You have ever been abused or do not feel safe at home. Summary  Adopting a healthy lifestyle and getting preventive care are important in promoting health and wellness.  Follow your health care provider's  instructions about healthy diet, exercising, and getting tested or screened for diseases.  Follow your health care provider's instructions on monitoring your cholesterol and blood pressure. This information is not intended to replace advice given to you by your health care provider. Make sure you discuss any questions you have with your health care provider. Document Revised: 09/12/2018 Document Reviewed: 09/12/2018 Elsevier Patient Education  2021 [employer].

## 2021-01-29 NOTE — Assessment & Plan Note (Signed)
Mammogram yearly.

## 2021-01-29 NOTE — Assessment & Plan Note (Signed)
Flu shot yearly. Covid-19 up to date. Shingrix complete. Tetanus up to date. Colonoscopy up to date. Mammogram up to date at [facility], pap smear up to date at gyn and dexa needs done ordered today. Counseled about sun safety and mole surveillance. Counseled about the dangers of distracted driving. Given 10 year screening recommendations.

## 2021-02-08 ENCOUNTER — Ambulatory Visit: Payer: 59 | Attending: Internal Medicine

## 2021-02-08 ENCOUNTER — Other Ambulatory Visit: Payer: Self-pay

## 2021-02-08 ENCOUNTER — Other Ambulatory Visit (HOSPITAL_BASED_OUTPATIENT_CLINIC_OR_DEPARTMENT_OTHER): Payer: Self-pay

## 2021-02-08 DIAGNOSIS — Z23 Encounter for immunization: Secondary | ICD-10-CM

## 2021-02-08 MED ORDER — PFIZER-BIONT COVID-19 VAC-TRIS 30 MCG/0.3ML IM SUSP
INTRAMUSCULAR | 0 refills | Status: DC
  Filled 2021-02-08: qty 0.3, 1d supply, fill #0

## 2021-02-08 NOTE — Progress Notes (Signed)
   Covid-19 Vaccination Clinic  Name:  Adriana Gonzalez    MRN: [id number] DOB: 02-25-1961  02/08/2021  Ms. Adriana Gonzalez was observed post Covid-19 immunization for 15 minutes without incident. She was provided with Vaccine Information Sheet and instruction to access the V-Safe system.   Ms. Adriana Gonzalez was instructed to call 911 with any severe reactions post vaccine: Marland Kitchen Difficulty breathing  . Swelling of face and throat  . A fast heartbeat  . A bad rash all over body  . Dizziness and weakness   Immunizations Administered    Name Date Dose VIS Date Route   PFIZER Comrnaty(Gray TOP) Covid-19 Vaccine 02/08/2021  2:08 PM 0.3 mL 09/10/2020 Intramuscular   Manufacturer: [place]   Lot: [id number]   NDC: [phone]

## 2021-02-12 ENCOUNTER — Ambulatory Visit (INDEPENDENT_AMBULATORY_CARE_PROVIDER_SITE_OTHER)
Admission: RE | Admit: 2021-02-12 | Discharge: 2021-02-12 | Disposition: A | Discharge status: Home / Self Care | Payer: 59 | Source: Ambulatory Visit | Attending: Internal Medicine | Admitting: Internal Medicine

## 2021-02-12 ENCOUNTER — Other Ambulatory Visit: Payer: Self-pay

## 2021-02-12 DIAGNOSIS — Z0000 Encounter for general adult medical examination without abnormal findings: Secondary | ICD-10-CM | POA: Diagnosis not present

## 2021-03-25 ENCOUNTER — Other Ambulatory Visit (HOSPITAL_COMMUNITY): Payer: Self-pay

## 2021-03-25 MED ORDER — CHLORHEXIDINE GLUCONATE 0.12 % MT SOLN
OROMUCOSAL | 0 refills | Status: DC
  Filled 2021-03-25: qty 473, 16d supply, fill #0

## 2021-03-25 MED ORDER — CLINDAMYCIN HCL 300 MG PO CAPS
300.0000 mg | ORAL_CAPSULE | Freq: Four times a day (QID) | ORAL | 0 refills | Status: DC
  Filled 2021-03-25: qty 20, 5d supply, fill #0

## 2021-05-27 DIAGNOSIS — Z1231 Encounter for screening mammogram for malignant neoplasm of breast: Secondary | ICD-10-CM | POA: Diagnosis not present

## 2021-07-06 ENCOUNTER — Other Ambulatory Visit (HOSPITAL_BASED_OUTPATIENT_CLINIC_OR_DEPARTMENT_OTHER): Payer: Self-pay

## 2021-07-06 ENCOUNTER — Ambulatory Visit: Payer: 59 | Attending: Internal Medicine

## 2021-07-06 DIAGNOSIS — Z23 Encounter for immunization: Secondary | ICD-10-CM

## 2021-07-06 MED ORDER — PFIZER COVID-19 VAC BIVALENT 30 MCG/0.3ML IM SUSP
INTRAMUSCULAR | 0 refills | Status: DC
  Filled 2021-07-06: qty 0.3, 1d supply, fill #0

## 2021-07-06 NOTE — Progress Notes (Signed)
   Covid-19 Vaccination Clinic  Name:  Adriana Gonzalez    MRN: [id number] DOB: 07-Jan-1961  07/06/2021  [name] was observed post Covid-19 immunization for 15 minutes without incident. She was provided with Vaccine Information Sheet and instruction to access the V-Safe system.   [name] was instructed to call 911 with any severe reactions post vaccine: Difficulty breathing  Swelling of face and throat  A fast heartbeat  A bad rash all over body  Dizziness and weakness

## 2021-08-20 DIAGNOSIS — H40053 Ocular hypertension, bilateral: Secondary | ICD-10-CM | POA: Diagnosis not present

## 2021-08-20 DIAGNOSIS — H0288B Meibomian gland dysfunction left eye, upper and lower eyelids: Secondary | ICD-10-CM | POA: Diagnosis not present

## 2021-08-20 DIAGNOSIS — H04123 Dry eye syndrome of bilateral lacrimal glands: Secondary | ICD-10-CM | POA: Diagnosis not present

## 2021-08-20 DIAGNOSIS — H52223 Regular astigmatism, bilateral: Secondary | ICD-10-CM | POA: Diagnosis not present

## 2021-08-20 DIAGNOSIS — H40033 Anatomical narrow angle, bilateral: Secondary | ICD-10-CM | POA: Diagnosis not present

## 2021-08-20 DIAGNOSIS — H5203 Hypermetropia, bilateral: Secondary | ICD-10-CM | POA: Diagnosis not present

## 2021-08-20 DIAGNOSIS — H2513 Age-related nuclear cataract, bilateral: Secondary | ICD-10-CM | POA: Diagnosis not present

## 2021-08-20 DIAGNOSIS — H524 Presbyopia: Secondary | ICD-10-CM | POA: Diagnosis not present

## 2021-08-20 DIAGNOSIS — H0288A Meibomian gland dysfunction right eye, upper and lower eyelids: Secondary | ICD-10-CM | POA: Diagnosis not present

## 2021-10-03 HISTORY — PX: CHOLECYSTECTOMY: SHX55

## 2021-11-05 DIAGNOSIS — H40053 Ocular hypertension, bilateral: Secondary | ICD-10-CM | POA: Diagnosis not present

## 2021-11-05 DIAGNOSIS — H0288A Meibomian gland dysfunction right eye, upper and lower eyelids: Secondary | ICD-10-CM | POA: Diagnosis not present

## 2021-11-05 DIAGNOSIS — H04123 Dry eye syndrome of bilateral lacrimal glands: Secondary | ICD-10-CM | POA: Diagnosis not present

## 2021-11-05 DIAGNOSIS — H2513 Age-related nuclear cataract, bilateral: Secondary | ICD-10-CM | POA: Diagnosis not present

## 2021-11-05 DIAGNOSIS — H0288B Meibomian gland dysfunction left eye, upper and lower eyelids: Secondary | ICD-10-CM | POA: Diagnosis not present

## 2021-11-05 DIAGNOSIS — H40033 Anatomical narrow angle, bilateral: Secondary | ICD-10-CM | POA: Diagnosis not present

## 2022-01-28 ENCOUNTER — Encounter: Payer: 59 | Admitting: Internal Medicine

## 2022-03-21 ENCOUNTER — Encounter: Payer: Self-pay | Admitting: Internal Medicine

## 2022-03-21 ENCOUNTER — Telehealth (INDEPENDENT_AMBULATORY_CARE_PROVIDER_SITE_OTHER): Payer: 59 | Admitting: Internal Medicine

## 2022-03-21 DIAGNOSIS — U071 COVID-19: Secondary | ICD-10-CM | POA: Diagnosis not present

## 2022-03-21 NOTE — Assessment & Plan Note (Signed)
Acute Tested positive 5 days ago Discussed antiviral medication-she does not feel that this is necessary so she will just continue over-the-counter cold medications for symptom relief She has very typical symptoms and her symptoms are mild.  She is experiencing some numbness in her fingers and around her nasal area, which COVID can do and most likely is the cause She will monitor this closely and if it progresses for any reason or does not resolve she will call We will continue DayQuil, NyQuil, Tylenol Can also take Advil and use her Flonase Rest, fluids She will call with any questions or concerns

## 2022-03-21 NOTE — Progress Notes (Signed)
Virtual Visit via Video Note  I connected with Adriana Gonzalez on 03/21/22 at  3:40 PM EDT by a video enabled telemedicine application and verified that I am speaking with the correct person using two identifiers.   I discussed the limitations of evaluation and management by telemedicine and the availability of in person appointments. The patient expressed understanding and agreed to proceed.  Present for the visit:  Myself, Dr Billey Gosling, Otho Najjar.  The patient is currently at home and I am in the office.    No referring provider.    History of Present Illness: This is an acute visit for covid.  She tested positive 5 days ago-today is day 5 of symptoms  She has had subjective fevers, ear pressure or clogged sensation, sore throat which has resolved, mild dry cough, dizziness, fatigue and some tingling around her nasal area almost like she had had anesthesia.  Sometimes she feels it in her fingers.  She is also lost her taste and smell.   Taking dayquil, nyquil, Tylenol  Review of Systems  Constitutional:  Negative for fever (subjective fevers).  HENT:  Positive for sore throat. Negative for congestion, ear pain (ears clogged) and sinus pain.        Loss of taste/smell  Eyes:  Negative for discharge and redness.  Respiratory:  Positive for cough (mild). Negative for shortness of breath and wheezing.   Gastrointestinal:  Negative for diarrhea (loose stools) and nausea.  Neurological:  Positive for dizziness and tingling (numbness/tingling around nose). Negative for headaches.      Social History   Socioeconomic History   Marital status: Married    Spouse name: Not on file   Number of children: Not on file   Years of education: Not on file   Highest education level: Not on file  Occupational History    Employer: [employer]  Tobacco Use   Smoking status: Never   Smokeless tobacco: Never  Vaping Use   Vaping Use: Never used  Substance and Sexual Activity   Alcohol  use: Yes    Alcohol/week: 3.0 standard drinks of alcohol    Types: 3 Standard drinks or equivalent per week    Comment: occasional   Drug use: No   Sexual activity: Yes  Other Topics Concern   Not on file  Social History Narrative   Not on file   Social Determinants of Health   Financial Resource Strain: Not on file  Food Insecurity: Not on file  Transportation Needs: Not on file  Physical Activity: Not on file  Stress: Not on file  Social Connections: Not on file     Observations/Objective: Appears well in NAD Breathing normally, speaking in full sentences Skin appears warm and dry  Assessment and Plan:  See Problem List for Assessment and Plan of chronic medical problems.   Follow Up Instructions:    I discussed the assessment and treatment plan with the patient. The patient was provided an opportunity to ask questions and all were answered. The patient agreed with the plan and demonstrated an understanding of the instructions.   The patient was advised to call back or seek an in-person evaluation if the symptoms worsen or if the condition fails to improve as anticipated.    Binnie Rail, MD

## 2022-04-08 DIAGNOSIS — R1011 Right upper quadrant pain: Secondary | ICD-10-CM | POA: Diagnosis not present

## 2022-04-08 DIAGNOSIS — I959 Hypotension, unspecified: Secondary | ICD-10-CM | POA: Diagnosis not present

## 2022-04-08 DIAGNOSIS — R0789 Other chest pain: Secondary | ICD-10-CM | POA: Diagnosis not present

## 2022-04-08 DIAGNOSIS — R1110 Vomiting, unspecified: Secondary | ICD-10-CM | POA: Diagnosis not present

## 2022-04-08 DIAGNOSIS — R112 Nausea with vomiting, unspecified: Secondary | ICD-10-CM | POA: Diagnosis not present

## 2022-04-08 DIAGNOSIS — R079 Chest pain, unspecified: Secondary | ICD-10-CM | POA: Diagnosis not present

## 2022-04-08 DIAGNOSIS — R1013 Epigastric pain: Secondary | ICD-10-CM | POA: Diagnosis not present

## 2022-04-08 DIAGNOSIS — R932 Abnormal findings on diagnostic imaging of liver and biliary tract: Secondary | ICD-10-CM | POA: Diagnosis not present

## 2022-04-08 DIAGNOSIS — K810 Acute cholecystitis: Secondary | ICD-10-CM | POA: Diagnosis not present

## 2022-04-08 DIAGNOSIS — K8000 Calculus of gallbladder with acute cholecystitis without obstruction: Secondary | ICD-10-CM | POA: Diagnosis not present

## 2022-04-08 DIAGNOSIS — Z8616 Personal history of COVID-19: Secondary | ICD-10-CM | POA: Diagnosis not present

## 2022-04-08 DIAGNOSIS — R109 Unspecified abdominal pain: Secondary | ICD-10-CM | POA: Diagnosis not present

## 2022-04-08 DIAGNOSIS — I10 Essential (primary) hypertension: Secondary | ICD-10-CM | POA: Diagnosis not present

## 2022-04-08 DIAGNOSIS — R899 Unspecified abnormal finding in specimens from other organs, systems and tissues: Secondary | ICD-10-CM | POA: Diagnosis not present

## 2022-04-08 DIAGNOSIS — K8010 Calculus of gallbladder with chronic cholecystitis without obstruction: Secondary | ICD-10-CM | POA: Diagnosis not present

## 2022-04-08 DIAGNOSIS — K2970 Gastritis, unspecified, without bleeding: Secondary | ICD-10-CM | POA: Diagnosis not present

## 2022-04-08 DIAGNOSIS — K8020 Calculus of gallbladder without cholecystitis without obstruction: Secondary | ICD-10-CM | POA: Diagnosis not present

## 2022-04-25 DIAGNOSIS — K8020 Calculus of gallbladder without cholecystitis without obstruction: Secondary | ICD-10-CM | POA: Diagnosis not present

## 2022-05-19 ENCOUNTER — Other Ambulatory Visit (HOSPITAL_BASED_OUTPATIENT_CLINIC_OR_DEPARTMENT_OTHER): Payer: Self-pay

## 2022-06-13 ENCOUNTER — Other Ambulatory Visit (HOSPITAL_COMMUNITY): Payer: Self-pay

## 2022-07-05 DIAGNOSIS — Z1231 Encounter for screening mammogram for malignant neoplasm of breast: Secondary | ICD-10-CM | POA: Diagnosis not present

## 2022-07-05 LAB — HM MAMMOGRAPHY

## 2022-07-14 ENCOUNTER — Other Ambulatory Visit (HOSPITAL_BASED_OUTPATIENT_CLINIC_OR_DEPARTMENT_OTHER): Payer: Self-pay

## 2022-07-14 MED ORDER — COVID-19 MRNA 2023-2024 VACCINE (COMIRNATY) 0.3 ML INJECTION
INTRAMUSCULAR | 0 refills | Status: DC
  Filled 2022-07-14: qty 0.3, 1d supply, fill #0

## 2022-10-10 ENCOUNTER — Other Ambulatory Visit (HOSPITAL_COMMUNITY): Payer: Self-pay

## 2022-11-07 ENCOUNTER — Telehealth: Payer: Self-pay | Admitting: Internal Medicine

## 2022-11-07 NOTE — Telephone Encounter (Signed)
Fine to get done then

## 2022-11-07 NOTE — Telephone Encounter (Signed)
Patient has physical scheduled for 11/28/22. She would like to know if she can get a pap smear done during that or if she needs a referral. Best callback number is [phone].

## 2022-11-07 NOTE — Telephone Encounter (Signed)
Patient will be getting a pap smear done day of physical as well

## 2022-11-28 ENCOUNTER — Encounter: Payer: Self-pay | Admitting: Internal Medicine

## 2022-11-28 ENCOUNTER — Ambulatory Visit (INDEPENDENT_AMBULATORY_CARE_PROVIDER_SITE_OTHER): Payer: 59 | Admitting: Internal Medicine

## 2022-11-28 ENCOUNTER — Other Ambulatory Visit (HOSPITAL_COMMUNITY)
Admission: RE | Admit: 2022-11-28 | Discharge: 2022-11-28 | Disposition: A | Discharge status: Home / Self Care | Payer: 59 | Source: Ambulatory Visit | Attending: Internal Medicine | Admitting: Internal Medicine

## 2022-11-28 VITALS — BP 138/80 | HR 62 | Temp 98.4°F | Ht 62.0 in | Wt 198.0 lb

## 2022-11-28 DIAGNOSIS — E6609 Other obesity due to excess calories: Secondary | ICD-10-CM

## 2022-11-28 DIAGNOSIS — Z0000 Encounter for general adult medical examination without abnormal findings: Secondary | ICD-10-CM | POA: Insufficient documentation

## 2022-11-28 DIAGNOSIS — Z6836 Body mass index (BMI) 36.0-36.9, adult: Secondary | ICD-10-CM

## 2022-11-28 LAB — CBC
HCT: 44.6 % (ref 36.0–46.0)
Hemoglobin: 14.9 g/dL (ref 12.0–15.0)
MCHC: 33.4 g/dL (ref 30.0–36.0)
MCV: 88.8 fl (ref 78.0–100.0)
Platelets: 535 10*3/uL — ABNORMAL HIGH (ref 150.0–400.0)
RBC: 5.03 Mil/uL (ref 3.87–5.11)
RDW: 15.3 % (ref 11.5–15.5)
WBC: 8.8 10*3/uL (ref 4.0–10.5)

## 2022-11-28 LAB — HEMOGLOBIN A1C: Hgb A1c MFr Bld: 5.7 % (ref 4.6–6.5)

## 2022-11-28 NOTE — Progress Notes (Unsigned)
   Subjective:   Patient ID: Adriana Gonzalez, female    DOB: May 21, 1961, 62 y.o.   MRN: [id number]  HPI The patient is here for physical.  PMH, [facility], social history reviewed and updated  Review of Systems  Constitutional: Negative.   HENT: Negative.    Eyes: Negative.   Respiratory:  Negative for cough, chest tightness and shortness of breath.   Cardiovascular:  Negative for chest pain, palpitations and leg swelling.  Gastrointestinal:  Negative for abdominal distention, abdominal pain, constipation, diarrhea, nausea and vomiting.  Musculoskeletal: Negative.   Skin: Negative.   Neurological: Negative.   Psychiatric/Behavioral: Negative.      Objective:  Physical Exam Constitutional:      Appearance: She is well-developed.  HENT:     Head: Normocephalic and atraumatic.  Cardiovascular:     Rate and Rhythm: Normal rate and regular rhythm.  Pulmonary:     Effort: Pulmonary effort is normal. No respiratory distress.     Breath sounds: Normal breath sounds. No wheezing or rales.  Abdominal:     General: Bowel sounds are normal. There is no distension.     Palpations: Abdomen is soft.     Tenderness: There is no abdominal tenderness. There is no rebound.  Genitourinary:    General: Normal vulva.     Vagina: No vaginal discharge.  Musculoskeletal:     Cervical back: Normal range of motion.  Skin:    General: Skin is warm and dry.  Neurological:     Mental Status: She is alert and oriented to person, place, and time.     Coordination: Coordination normal.     Vitals:   11/28/22 1508  BP: 138/80  Pulse: 62  Temp: 98.4 F (36.9 C)  TempSrc: Oral  SpO2: 99%  Weight: 198 lb (89.8 kg)  Height: 5' 2"$  (1.575 m)    Assessment & Plan:

## 2022-11-29 LAB — COMPREHENSIVE METABOLIC PANEL
ALT: 38 U/L — ABNORMAL HIGH (ref 0–35)
AST: 24 U/L (ref 0–37)
Albumin: 4.6 g/dL (ref 3.5–5.2)
Alkaline Phosphatase: 70 U/L (ref 39–117)
BUN: 18 mg/dL (ref 6–23)
CO2: 29 mEq/L (ref 19–32)
Calcium: 10.6 mg/dL — ABNORMAL HIGH (ref 8.4–10.5)
Chloride: 102 mEq/L (ref 96–112)
Creatinine, Ser: 0.95 mg/dL (ref 0.40–1.20)
GFR: 64.26 mL/min (ref >60.00)
Glucose, Bld: 85 mg/dL (ref 70–99)
Potassium: 4.1 mEq/L (ref 3.5–5.1)
Sodium: 139 mEq/L (ref 135–145)
Total Bilirubin: 0.5 mg/dL (ref 0.2–1.2)
Total Protein: 7.4 g/dL (ref 6.0–8.3)

## 2022-11-29 LAB — LIPID PANEL
Cholesterol: 242 mg/dL — ABNORMAL HIGH (ref 0–200)
HDL: 65.6 mg/dL (ref >39.00)
LDL Cholesterol: 155 mg/dL — ABNORMAL HIGH (ref 0–99)
NonHDL: 176.6
Total CHOL/HDL Ratio: 4
Triglycerides: 106 mg/dL (ref 0.0–149.0)
VLDL: 21.2 mg/dL (ref 0.0–40.0)

## 2022-11-29 NOTE — Assessment & Plan Note (Signed)
Counseled about diet and exercise.

## 2022-11-29 NOTE — Assessment & Plan Note (Signed)
Flu shot up to date. Covid-19 counseled. Shingrix complete. Tetanus counseled due. Colonoscopy up to date. Mammogram up to date, pap smear done at visit. Counseled about sun safety and mole surveillance. Counseled about the dangers of distracted driving. Given 10 year screening recommendations.

## 2022-12-02 LAB — CYTOLOGY - PAP
Comment: NEGATIVE
Diagnosis: NEGATIVE
High risk HPV: NEGATIVE

## 2022-12-23 ENCOUNTER — Other Ambulatory Visit (HOSPITAL_COMMUNITY): Payer: Self-pay

## 2022-12-29 DIAGNOSIS — H2513 Age-related nuclear cataract, bilateral: Secondary | ICD-10-CM | POA: Diagnosis not present

## 2022-12-29 DIAGNOSIS — H0288B Meibomian gland dysfunction left eye, upper and lower eyelids: Secondary | ICD-10-CM | POA: Diagnosis not present

## 2022-12-29 DIAGNOSIS — H0288A Meibomian gland dysfunction right eye, upper and lower eyelids: Secondary | ICD-10-CM | POA: Diagnosis not present

## 2022-12-29 DIAGNOSIS — H04123 Dry eye syndrome of bilateral lacrimal glands: Secondary | ICD-10-CM | POA: Diagnosis not present

## 2022-12-29 DIAGNOSIS — H40053 Ocular hypertension, bilateral: Secondary | ICD-10-CM | POA: Diagnosis not present

## 2022-12-29 DIAGNOSIS — H40033 Anatomical narrow angle, bilateral: Secondary | ICD-10-CM | POA: Diagnosis not present

## 2023-01-31 ENCOUNTER — Other Ambulatory Visit (HOSPITAL_COMMUNITY): Payer: Self-pay

## 2023-04-12 ENCOUNTER — Other Ambulatory Visit (HOSPITAL_COMMUNITY): Payer: Self-pay

## 2023-04-12 MED ORDER — DICLOFENAC SODIUM 1 % EX GEL
2.0000 g | Freq: Three times a day (TID) | CUTANEOUS | 3 refills | Status: AC | PRN
  Filled 2023-04-12: qty 100, 10d supply, fill #0
  Filled 2023-04-18: qty 100, 30d supply, fill #0

## 2023-04-18 ENCOUNTER — Other Ambulatory Visit (HOSPITAL_COMMUNITY): Payer: Self-pay

## 2023-04-19 ENCOUNTER — Other Ambulatory Visit (HOSPITAL_COMMUNITY): Payer: Self-pay

## 2023-04-20 ENCOUNTER — Other Ambulatory Visit (HOSPITAL_COMMUNITY): Payer: Self-pay

## 2023-04-24 ENCOUNTER — Other Ambulatory Visit (HOSPITAL_COMMUNITY): Payer: Self-pay

## 2023-04-24 ENCOUNTER — Other Ambulatory Visit: Payer: Self-pay

## 2023-06-22 ENCOUNTER — Other Ambulatory Visit (HOSPITAL_BASED_OUTPATIENT_CLINIC_OR_DEPARTMENT_OTHER): Payer: Self-pay

## 2023-06-22 MED ORDER — COVID-19 MRNA VAC-TRIS(PFIZER) 30 MCG/0.3ML IM SUSY
0.3000 mL | PREFILLED_SYRINGE | Freq: Once | INTRAMUSCULAR | 0 refills | Status: AC
  Filled 2023-06-22: qty 0.3, 1d supply, fill #0

## 2023-07-19 ENCOUNTER — Other Ambulatory Visit (HOSPITAL_BASED_OUTPATIENT_CLINIC_OR_DEPARTMENT_OTHER): Payer: Self-pay

## 2023-07-19 MED ORDER — INFLUENZA VIRUS VACC SPLIT PF (FLUZONE) 0.5 ML IM SUSY
0.5000 mL | PREFILLED_SYRINGE | Freq: Once | INTRAMUSCULAR | 0 refills | Status: AC
  Filled 2023-07-19: qty 0.5, 1d supply, fill #0

## 2023-07-20 ENCOUNTER — Other Ambulatory Visit (HOSPITAL_BASED_OUTPATIENT_CLINIC_OR_DEPARTMENT_OTHER): Payer: Self-pay

## 2023-08-18 DIAGNOSIS — Z1231 Encounter for screening mammogram for malignant neoplasm of breast: Secondary | ICD-10-CM | POA: Diagnosis not present

## 2023-08-18 LAB — HM MAMMOGRAPHY

## 2023-10-17 ENCOUNTER — Ambulatory Visit (INDEPENDENT_AMBULATORY_CARE_PROVIDER_SITE_OTHER): Payer: 59 | Admitting: Physician Assistant

## 2023-10-17 ENCOUNTER — Encounter: Payer: Self-pay | Admitting: Physician Assistant

## 2023-10-17 DIAGNOSIS — M65312 Trigger thumb, left thumb: Secondary | ICD-10-CM

## 2023-10-17 MED ORDER — BUPIVACAINE HCL 0.25 % IJ SOLN
0.3333 mL | INTRAMUSCULAR | Status: AC | PRN
Start: 2023-10-17 — End: 2023-10-17
  Administered 2023-10-17: .3333 mL

## 2023-10-17 MED ORDER — METHYLPREDNISOLONE ACETATE 40 MG/ML IJ SUSP
13.0000 mg | INTRAMUSCULAR | Status: AC | PRN
Start: 2023-10-17 — End: 2023-10-17
  Administered 2023-10-17: 13 mg

## 2023-10-17 MED ORDER — LIDOCAINE HCL 1 % IJ SOLN
0.3333 mL | INTRAMUSCULAR | Status: AC | PRN
Start: 2023-10-17 — End: 2023-10-17
  Administered 2023-10-17: .3333 mL

## 2023-10-17 NOTE — Progress Notes (Signed)
 Office Visit Note   Patient: Adriana Gonzalez           Date of Birth: [birth year]           MRN: [id number] Visit Date: 10/17/2023              Requested by: Rollene Almarie LABOR, MD [address] [place],  [place] [address] PCP: Rollene Almarie LABOR, MD   Assessment & Plan: Visit Diagnoses:  1. Trigger thumb, left thumb     Plan: Impression is left trigger thumb.  Today, we discussed various treatment options to include cortisone injection and night splinting versus A1 pulley release.  She would like to first try cortisone injection.  She has a trigger finger splint at home which she will start wearing tonight.  She will follow-up with us  as needed.  Call with concerns or questions.  Follow-Up Instructions: Return if symptoms worsen or fail to improve.   Orders:  Orders Placed This Encounter  Procedures   Hand/UE Inj: L thumb A1   No orders of the defined types were placed in this encounter.     Procedures: Hand/UE Inj: L thumb A1 for trigger finger on 10/17/2023 12:49 PM Medications: 0.3333 mL lidocaine  1 %; 0.3333 mL bupivacaine  0.25 %; 13 mg methylPREDNISolone  acetate 40 MG/ML      Clinical Data: No additional findings.   Subjective: Chief Complaint  Patient presents with   Left Hand - Pain    thumb    HPI patient is a very pleasant 63 year old ambidextrous female who comes in today with pain and triggering to the left thumb.  Symptoms began several weeks ago but worsened over the past 3 weeks.  She has pain not only to the base of the thumb but into the IP joint where she triggers.  History of right trigger thumb which was injected with cortisone in May 2020.  This significantly helped.  Review of Systems as detailed in HPI.  All others reviewed and are negative.   Objective: Vital Signs: There were no vitals taken for this visit.  Physical Exam well-developed well-nourished female in no acute distress.  Alert and oriented x 3.  Ortho Exam left thumb  exam: Tenderness as well as palpable nodule A1 pulley.  She does have reproducible triggering.  She is neurovascularly intact distally.  Specialty Comments:  No specialty comments available.  Imaging: No new imaging   PMFS History: Patient Active Problem List   Diagnosis Date Noted   Allergic rhinitis 12/31/2014   Obesity 09/11/2014   Routine general medical examination at a health care facility 09/11/2014   Atypical lobular hyperplasia of breast 01/27/2012   Past Medical History:  Diagnosis Date   Adenomatous colon polyp 2014   Allergy    Anemia    Atypical lobular hyperplasia of breast 2010    Family History  Problem Relation Age of Onset   Colon cancer Neg Hx    Stomach cancer Neg Hx    Colon polyps Neg Hx    Esophageal cancer Neg Hx    Rectal cancer Neg Hx    Pancreatic cancer Neg Hx     Past Surgical History:  Procedure Laterality Date   BREAST LUMPECTOMY W/ NEEDLE LOCALIZATION  2010   right   CESAREAN SECTION  1987, 1993, 1997   x3   COLONOSCOPY     TONSILLECTOMY  1980   Social History   Occupational History    Employer: [employer]  Tobacco Use  Smoking status: Never   Smokeless tobacco: Never  Vaping Use   Vaping status: Never Used  Substance and Sexual Activity   Alcohol use: Yes    Alcohol/week: 3.0 standard drinks of alcohol    Types: 3 Standard drinks or equivalent per week    Comment: occasional   Drug use: No   Sexual activity: Yes

## 2024-01-02 DIAGNOSIS — H0288A Meibomian gland dysfunction right eye, upper and lower eyelids: Secondary | ICD-10-CM | POA: Diagnosis not present

## 2024-01-02 DIAGNOSIS — H04123 Dry eye syndrome of bilateral lacrimal glands: Secondary | ICD-10-CM | POA: Diagnosis not present

## 2024-01-02 DIAGNOSIS — H40033 Anatomical narrow angle, bilateral: Secondary | ICD-10-CM | POA: Diagnosis not present

## 2024-01-02 DIAGNOSIS — H2513 Age-related nuclear cataract, bilateral: Secondary | ICD-10-CM | POA: Diagnosis not present

## 2024-01-02 DIAGNOSIS — H0288B Meibomian gland dysfunction left eye, upper and lower eyelids: Secondary | ICD-10-CM | POA: Diagnosis not present

## 2024-01-02 DIAGNOSIS — H40053 Ocular hypertension, bilateral: Secondary | ICD-10-CM | POA: Diagnosis not present

## 2024-04-09 ENCOUNTER — Ambulatory Visit (INDEPENDENT_AMBULATORY_CARE_PROVIDER_SITE_OTHER): Admitting: Internal Medicine

## 2024-04-09 ENCOUNTER — Other Ambulatory Visit (HOSPITAL_COMMUNITY): Payer: Self-pay

## 2024-04-09 ENCOUNTER — Encounter: Payer: Self-pay | Admitting: Internal Medicine

## 2024-04-09 VITALS — BP 132/80 | HR 60 | Temp 98.0°F | Ht 62.0 in | Wt 203.0 lb

## 2024-04-09 DIAGNOSIS — E66812 Obesity, class 2: Secondary | ICD-10-CM | POA: Diagnosis not present

## 2024-04-09 DIAGNOSIS — E6609 Other obesity due to excess calories: Secondary | ICD-10-CM

## 2024-04-09 DIAGNOSIS — Z6837 Body mass index (BMI) 37.0-37.9, adult: Secondary | ICD-10-CM

## 2024-04-09 DIAGNOSIS — Z0000 Encounter for general adult medical examination without abnormal findings: Secondary | ICD-10-CM | POA: Diagnosis not present

## 2024-04-09 MED ORDER — ESTRADIOL 0.1 MG/GM VA CREA
1.0000 | TOPICAL_CREAM | Freq: Every day | VAGINAL | 12 refills | Status: AC
  Filled 2024-04-09 – 2024-04-25 (×5): qty 42.5, 30d supply, fill #0

## 2024-04-09 NOTE — Progress Notes (Unsigned)
   Subjective:   Patient ID: Adriana Gonzalez, female    DOB: 01-Aug-1961, 63 y.o.   MRN: [id number]  HPI The patient is here for physical.  PMH, [facility], social history reviewed and updated  Review of Systems  Objective:  Physical Exam  Vitals:   04/09/24 1537  BP: 132/80  Pulse: 60  Temp: 98 F (36.7 C)  TempSrc: Oral  SpO2: 98%  Weight: 203 lb (92.1 kg)  Height: 5' 2 (1.575 m)    Assessment & Plan:

## 2024-04-10 LAB — CBC
HCT: 42.7 % (ref 36.0–46.0)
Hemoglobin: 14.2 g/dL (ref 12.0–15.0)
MCHC: 33.1 g/dL (ref 30.0–36.0)
MCV: 89.3 fl (ref 78.0–100.0)
Platelets: 502 K/uL — ABNORMAL HIGH (ref 150.0–400.0)
RBC: 4.78 Mil/uL (ref 3.87–5.11)
RDW: 14.9 % (ref 11.5–15.5)
WBC: 8.9 K/uL (ref 4.0–10.5)

## 2024-04-10 LAB — COMPREHENSIVE METABOLIC PANEL WITH GFR
ALT: 30 U/L (ref 0–35)
AST: 25 U/L (ref 0–37)
Albumin: 4.6 g/dL (ref 3.5–5.2)
Alkaline Phosphatase: 63 U/L (ref 39–117)
BUN: 17 mg/dL (ref 6–23)
CO2: 28 meq/L (ref 19–32)
Calcium: 9.5 mg/dL (ref 8.4–10.5)
Chloride: 106 meq/L (ref 96–112)
Creatinine, Ser: 0.79 mg/dL (ref 0.40–1.20)
GFR: 79.41 mL/min (ref >60.00)
Glucose, Bld: 82 mg/dL (ref 70–99)
Potassium: 4.6 meq/L (ref 3.5–5.1)
Sodium: 138 meq/L (ref 135–145)
Total Bilirubin: 0.3 mg/dL (ref 0.2–1.2)
Total Protein: 7 g/dL (ref 6.0–8.3)

## 2024-04-10 LAB — LIPID PANEL
Cholesterol: 206 mg/dL — ABNORMAL HIGH (ref 0–200)
HDL: 52.9 mg/dL (ref >39.00)
LDL Cholesterol: 114 mg/dL — ABNORMAL HIGH (ref 0–99)
NonHDL: 153.01
Total CHOL/HDL Ratio: 4
Triglycerides: 193 mg/dL — ABNORMAL HIGH (ref 0.0–149.0)
VLDL: 38.6 mg/dL (ref 0.0–40.0)

## 2024-04-10 LAB — HEMOGLOBIN A1C: Hgb A1c MFr Bld: 5.9 % (ref 4.6–6.5)

## 2024-04-11 ENCOUNTER — Other Ambulatory Visit: Payer: Self-pay

## 2024-04-11 ENCOUNTER — Other Ambulatory Visit (HOSPITAL_COMMUNITY): Payer: Self-pay

## 2024-04-11 NOTE — Assessment & Plan Note (Signed)
Flu shot yearly. Shingrix complete. Tetanus up to date. Colonoscopy up to date. Mammogram up to date, pap smear up to date. Counseled about sun safety and mole surveillance. Counseled about the dangers of distracted driving. Given 10 year screening recommendations.

## 2024-04-11 NOTE — Assessment & Plan Note (Signed)
 BMI 37 not complicated. Checking lipid panel and HgA1c for screening. Counseled about diet and exercise.

## 2024-04-12 ENCOUNTER — Other Ambulatory Visit: Payer: Self-pay

## 2024-04-12 ENCOUNTER — Encounter: Payer: Self-pay | Admitting: Pharmacist

## 2024-04-15 ENCOUNTER — Ambulatory Visit: Payer: Self-pay | Admitting: Internal Medicine

## 2024-04-17 ENCOUNTER — Other Ambulatory Visit: Payer: Self-pay

## 2024-04-18 ENCOUNTER — Other Ambulatory Visit: Payer: Self-pay

## 2024-04-18 ENCOUNTER — Other Ambulatory Visit (HOSPITAL_COMMUNITY): Payer: Self-pay

## 2024-04-25 ENCOUNTER — Other Ambulatory Visit (HOSPITAL_COMMUNITY): Payer: Self-pay

## 2024-04-26 ENCOUNTER — Other Ambulatory Visit: Payer: Self-pay

## 2024-04-29 ENCOUNTER — Other Ambulatory Visit (HOSPITAL_COMMUNITY): Payer: Self-pay

## 2024-07-08 ENCOUNTER — Encounter: Payer: Self-pay | Admitting: Internal Medicine

## 2024-07-08 NOTE — Telephone Encounter (Signed)
**Note De-identified  [place] Obfuscation** Please advise 

## 2024-07-17 ENCOUNTER — Other Ambulatory Visit (HOSPITAL_BASED_OUTPATIENT_CLINIC_OR_DEPARTMENT_OTHER): Payer: Self-pay

## 2024-07-17 MED ORDER — FLUZONE 0.5 ML IM SUSY
0.5000 mL | PREFILLED_SYRINGE | Freq: Once | INTRAMUSCULAR | 0 refills | Status: AC
  Filled 2024-07-17: qty 0.5, 1d supply, fill #0

## 2024-07-22 ENCOUNTER — Encounter

## 2024-08-05 ENCOUNTER — Encounter: Payer: Self-pay | Admitting: Radiology

## 2024-09-04 ENCOUNTER — Other Ambulatory Visit (HOSPITAL_BASED_OUTPATIENT_CLINIC_OR_DEPARTMENT_OTHER): Payer: Self-pay

## 2024-09-04 DIAGNOSIS — Z1231 Encounter for screening mammogram for malignant neoplasm of breast: Secondary | ICD-10-CM | POA: Diagnosis not present

## 2024-09-04 LAB — HM MAMMOGRAPHY

## 2024-09-04 MED ORDER — COMIRNATY 30 MCG/0.3ML IM SUSY
0.3000 mL | PREFILLED_SYRINGE | Freq: Once | INTRAMUSCULAR | 0 refills | Status: AC
  Filled 2024-09-04: qty 0.3, 1d supply, fill #0

## 2024-09-05 ENCOUNTER — Encounter: Payer: Self-pay | Admitting: Internal Medicine
